# Patient Record
Sex: Male | Born: 1950 | Race: White | Hispanic: No | Marital: Married | Smoking: Never smoker
Health system: Southern US, Community
[De-identification: ages and names within clinical notes are randomized; demographics above are authoritative.]

## PROBLEM LIST (undated history)

## (undated) DIAGNOSIS — M549 Dorsalgia, unspecified: Secondary | ICD-10-CM

## (undated) HISTORY — PX: HERNIA REPAIR: SHX51

---

## 2003-01-28 ENCOUNTER — Ambulatory Visit (HOSPITAL_COMMUNITY): Admission: RE | Admit: 2003-01-28 | Discharge: 2003-01-28 | Payer: Self-pay | Admitting: Gastroenterology

## 2011-07-20 ENCOUNTER — Encounter (HOSPITAL_COMMUNITY): Payer: Self-pay

## 2011-07-20 ENCOUNTER — Emergency Department (HOSPITAL_COMMUNITY)
Admission: EM | Admit: 2011-07-20 | Discharge: 2011-07-21 | Disposition: A | Discharge status: Home Health | Payer: BC Managed Care – PPO | Attending: Emergency Medicine | Admitting: Emergency Medicine

## 2011-07-20 DIAGNOSIS — R112 Nausea with vomiting, unspecified: Secondary | ICD-10-CM | POA: Insufficient documentation

## 2011-07-20 DIAGNOSIS — R197 Diarrhea, unspecified: Secondary | ICD-10-CM | POA: Insufficient documentation

## 2011-07-20 DIAGNOSIS — R1110 Vomiting, unspecified: Secondary | ICD-10-CM

## 2011-07-20 HISTORY — DX: Dorsalgia, unspecified: M54.9

## 2011-07-20 NOTE — ED Notes (Signed)
Pt presents with no acute distress- N/V/d since this am

## 2011-07-21 LAB — DIFFERENTIAL
Basophils Relative: 0 % (ref 0–1)
Eosinophils Absolute: 0 10*3/uL (ref 0.0–0.7)
Monocytes Absolute: 0.6 10*3/uL (ref 0.1–1.0)
Monocytes Relative: 4 % (ref 3–12)

## 2011-07-21 LAB — URINALYSIS, ROUTINE W REFLEX MICROSCOPIC
Glucose, UA: 100 mg/dL — AB
Nitrite: NEGATIVE
Protein, ur: >300 mg/dL — AB

## 2011-07-21 LAB — COMPREHENSIVE METABOLIC PANEL
Albumin: 5 g/dL (ref 3.5–5.2)
BUN: 27 mg/dL — ABNORMAL HIGH (ref 6–23)
Chloride: 101 mEq/L (ref 96–112)
Creatinine, Ser: 1.66 mg/dL — ABNORMAL HIGH (ref 0.50–1.35)
GFR calc Af Amer: 51 mL/min — ABNORMAL LOW (ref >90)
Glucose, Bld: 130 mg/dL — ABNORMAL HIGH (ref 70–99)
Total Bilirubin: 0.8 mg/dL (ref 0.3–1.2)
Total Protein: 8.8 g/dL — ABNORMAL HIGH (ref 6.0–8.3)

## 2011-07-21 LAB — CBC
HCT: 51.5 % (ref 39.0–52.0)
Hemoglobin: 18.1 g/dL — ABNORMAL HIGH (ref 13.0–17.0)
MCH: 30.1 pg (ref 26.0–34.0)
MCHC: 35.1 g/dL (ref 30.0–36.0)

## 2011-07-21 LAB — URINE MICROSCOPIC-ADD ON

## 2011-07-21 MED ORDER — ONDANSETRON HCL 4 MG/2ML IJ SOLN
4.0000 mg | Freq: Once | INTRAMUSCULAR | Status: AC
  Administered 2011-07-21: 4 mg via INTRAVENOUS
  Filled 2011-07-21: qty 2

## 2011-07-21 MED ORDER — ONDANSETRON HCL 4 MG PO TABS: 4.0000 mg | ORAL_TABLET | Freq: Four times a day (QID) | ORAL | Status: AC

## 2011-07-21 MED ORDER — ACETAMINOPHEN 325 MG PO TABS
650.0000 mg | ORAL_TABLET | Freq: Once | ORAL | Status: AC
  Administered 2011-07-21: 650 mg via ORAL
  Filled 2011-07-21: qty 1

## 2011-07-21 MED ORDER — SODIUM CHLORIDE 0.9 % IV BOLUS (SEPSIS)
1000.0000 mL | Freq: Once | INTRAVENOUS | Status: AC
  Administered 2011-07-21: 1000 mL via INTRAVENOUS

## 2011-07-21 NOTE — ED Provider Notes (Signed)
History     CSN: [id number]  Arrival date & time 07/20/11  2103   First MD Initiated Contact with Patient 07/21/11 0009      Chief Complaint  Patient presents with  . Nausea  . Emesis  . Diarrhea    (Consider location/radiation/quality/duration/timing/severity/associated sxs/prior treatment) Patient is a 61 y.o. male presenting with vomiting. The history is provided by the patient.  Emesis  This is a new problem. The current episode started 6 to 12 hours ago. The problem occurs 5 to 10 times per day. The problem has not changed since onset.The emesis has an appearance of stomach contents. The maximum temperature recorded prior to his arrival was 101 to 101.9 F. The fever has been present for less than 1 day. Associated symptoms include abdominal pain, chills, diarrhea and a fever. Associated symptoms comments: Abdominal cramps. Risk factors include ill contacts.    Past Medical History  Diagnosis Date  . Back pain     Past Surgical History  Procedure Date  . Hernia repair     No family history on file.  History  Substance Use Topics  . Smoking status: Never Smoker   . Smokeless tobacco: Not on file  . Alcohol Use: No      Review of Systems  Constitutional: Positive for fever and chills.  Gastrointestinal: Positive for vomiting, abdominal pain and diarrhea.  All other systems reviewed and are negative.    Allergies  Review of patient's allergies indicates no known allergies.  Home Medications   Current Outpatient Rx  Name Route Sig Dispense Refill  . CALCIUM CARBONATE ANTACID 500 MG PO CHEW Oral Chew 1-2 tablets by mouth 3 (three) times daily as needed. For indigestion      BP 129/93  Pulse 109  Temp(Src) 99.4 F (37.4 C) (Oral)  Resp 18  Wt 230 lb (104.327 kg)  SpO2 100%  Physical Exam  Nursing note and vitals reviewed. Constitutional: He is oriented to person, place, and time. He appears well-developed and well-nourished. No distress.  HENT:    Head: Normocephalic and atraumatic.  Mouth/Throat: Oropharynx is clear and moist. Mucous membranes are dry.  Eyes: Conjunctivae and EOM are normal. Pupils are equal, round, and reactive to light.  Neck: Normal range of motion. Neck supple.  Cardiovascular: Regular rhythm and intact distal pulses.  Tachycardia present.   No murmur heard. Pulmonary/Chest: Effort normal and breath sounds normal. No respiratory distress. He has no wheezes. He has no rales.  Abdominal: Soft. He exhibits no distension. There is no tenderness. There is no rebound and no guarding.  Musculoskeletal: Normal range of motion. He exhibits no edema and no tenderness.  Neurological: He is alert and oriented to person, place, and time.  Skin: Skin is warm and dry. No rash noted. No erythema.  Psychiatric: He has a normal mood and affect. His behavior is normal.    ED Course  Procedures (including critical care time)  Labs Reviewed  CBC - Abnormal; Notable for the following:    WBC 13.7 (*)    RBC 6.01 (*)    Hemoglobin 18.1 (*)    All other components within normal limits  DIFFERENTIAL - Abnormal; Notable for the following:    Neutrophils Relative 93 (*)    Neutro Abs 12.7 (*)    Lymphocytes Relative 3 (*)    Lymphs Abs 0.4 (*)    All other components within normal limits  COMPREHENSIVE METABOLIC PANEL - Abnormal; Notable for the following:  Glucose, Bld 130 (*)    BUN 27 (*)    Creatinine, Ser 1.66 (*)    Total Protein 8.8 (*)    GFR calc non Af Amer 44 (*)    GFR calc Af Amer 51 (*)    All other components within normal limits  URINALYSIS, ROUTINE W REFLEX MICROSCOPIC - Abnormal; Notable for the following:    Color, Urine ORANGE (*) BIOCHEMICALS MAY BE AFFECTED BY COLOR   APPearance CLOUDY (*)    Specific Gravity, Urine 1.035 (*)    Glucose, UA 100 (*)    Bilirubin Urine MODERATE (*)    Ketones, ur 15 (*)    Protein, ur >300 (*)    Leukocytes, UA SMALL (*)    All other components within normal  limits  URINE MICROSCOPIC-ADD ON - Abnormal; Notable for the following:    Squamous Epithelial / LPF FEW (*)    Bacteria, UA MANY (*)    Casts HYALINE CASTS (*)    All other components within normal limits   No results found.   1. Vomiting and diarrhea       MDM   Pt with symptoms most consistent with a viral process with fever/vomitting/diarrhea.  Denies bad food exposure and recent travel out of the country.  No recent abx.  No hx concerning for GU pathology or kidney stones.  Pt is awake and alert on exam without peritoneal signs.  No exam findings suggestive of appendicitis, pancreatitis, diverticulitis.  The patient had lab tests done prior to me seeing him. It showed leukocytosis and a contaminated urine sample however patient has no complaints of urinary symptoms and feel that is unlikely. Due to the sudden nature of his above symptoms feel a viral process is most likely.  Pt after fluids is tolerating po's and no more abd cramping and abd exam is benign.  Pt dced home to f/u with PCP if not better by tues.        Gwyneth Sprout, MD 07/21/11 [phone]

## 2011-07-21 NOTE — Discharge Instructions (Signed)
Diet for Diarrhea, Adult Having frequent, runny stools (diarrhea) has many causes. Diarrhea may be caused or worsened by food or drink. Diarrhea may be relieved by changing your diet. IF YOU ARE NOT TOLERATING SOLID FOODS:  Drink enough water and fluids to keep your urine clear or pale yellow.   Avoid sugary drinks and sodas as well as milk-based beverages.   Avoid beverages containing caffeine and alcohol.   You may try rehydrating beverages. You can make your own by following this recipe:    tsp table salt.    tsp baking soda.   ? tsp salt substitute (potassium chloride).   1 tbs + 1 tsp sugar.   1 qt water.  As your stools become more solid, you can start eating solid foods. Add foods one at a time. If a certain food causes your diarrhea to get worse, avoid that food and try other foods. A low fiber, low-fat, and lactose-free diet is recommended. Small, frequent meals may be better tolerated.  Starches  Allowed:  White, French, and pita breads, plain rolls, buns, bagels. Plain muffins, matzo. Soda, saltine, or graham crackers. Pretzels, melba toast, zwieback. Cooked cereals made with water: cornmeal, farina, cream cereals. Dry cereals: refined corn, wheat, rice. Potatoes prepared any way without skins, refined macaroni, spaghetti, noodles, refined rice.   Avoid:  Bread, rolls, or crackers made with whole wheat, multi-grains, rye, bran seeds, nuts, or coconut. Corn tortillas or taco shells. Cereals containing whole grains, multi-grains, bran, coconut, nuts, or raisins. Cooked or dry oatmeal. Coarse wheat cereals, granola. Cereals advertised as "high-fiber." Potato skins. Whole grain pasta, wild or brown rice. Popcorn. Sweet potatoes/yams. Sweet rolls, doughnuts, waffles, pancakes, sweet breads.  Vegetables  Allowed: Strained tomato and vegetable juices. Most well-cooked and canned vegetables without seeds. Fresh: Tender lettuce, cucumber without the skin, cabbage, spinach, bean  sprouts.   Avoid: Fresh, cooked, or canned: Artichokes, baked beans, beet greens, broccoli, Brussels sprouts, corn, kale, legumes, peas, sweet potatoes. Cooked: Green or red cabbage, spinach. Avoid large servings of any vegetables, because vegetables shrink when cooked, and they contain more fiber per serving than fresh vegetables.  Fruit  Allowed: All fruit juices except prune juice. Cooked or canned: Apricots, applesauce, cantaloupe, cherries, fruit cocktail, grapefruit, grapes, kiwi, mandarin oranges, peaches, pears, plums, watermelon. Fresh: Apples without skin, ripe banana, grapes, cantaloupe, cherries, grapefruit, peaches, oranges, plums. Keep servings limited to  cup or 1 piece.   Avoid: Fresh: Apple with skin, apricots, mango, pears, raspberries, strawberries. Prune juice, stewed or dried prunes. Dried fruits, raisins, dates. Large servings of all fresh fruits.  Meat and Meat Substitutes  Allowed: Ground or well-cooked tender beef, ham, veal, lamb, pork, or poultry. Eggs, plain cheese. Fish, oysters, shrimp, lobster, other seafoods. Liver, organ meats.   Avoid: Tough, fibrous meats with gristle. Peanut butter, smooth or chunky. Cheese, nuts, seeds, legumes, dried peas, beans, lentils.  Milk  Allowed: Yogurt, lactose-free milk, kefir, drinkable yogurt, buttermilk, soy milk.   Avoid: Milk, chocolate milk, beverages made with milk, such as milk shakes.  Soups  Allowed: Bouillon, broth, or soups made from allowed foods. Any strained soup.   Avoid: Soups made from vegetables that are not allowed, cream or milk-based soups.  Desserts and Sweets  Allowed: Sugar-free gelatin, sugar-free frozen ice pops made without sugar alcohol.   Avoid: Plain cakes and cookies, pie made with allowed fruit, pudding, custard, cream pie. Gelatin, fruit, ice, sherbet, frozen ice pops. Ice cream, ice milk without nuts. Plain hard candy,   honey, jelly, molasses, syrup, sugar, chocolate syrup, gumdrops,  marshmallows.  Fats and Oils  Allowed: Avoid any fats and oils.   Avoid: Seeds, nuts, olives, avocados. Margarine, butter, cream, mayonnaise, salad oils, plain salad dressings made from allowed foods. Plain gravy, crisp bacon without rind.  Beverages  Allowed: Water, decaffeinated teas, oral rehydration solutions, sugar-free beverages.   Avoid: Fruit juices, caffeinated beverages (coffee, tea, soda or pop), alcohol, sports drinks, or lemon-lime soda or pop.  Condiments  Allowed: Ketchup, mustard, horseradish, vinegar, cream sauce, cheese sauce, cocoa powder. Spices in moderation: allspice, basil, bay leaves, celery powder or leaves, cinnamon, cumin powder, curry powder, ginger, mace, marjoram, onion or garlic powder, oregano, paprika, parsley flakes, ground pepper, rosemary, sage, savory, tarragon, thyme, turmeric.   Avoid: Coconut, honey.  Weight Monitoring: Weigh yourself every day. You should weigh yourself in the morning after you urinate and before you eat breakfast. Wear the same amount of clothing when you weigh yourself. Record your weight daily. Bring your recorded weights to your clinic visits. Tell your caregiver right away if you have gained 3 lb/1.4 kg or more in 1 day, 5 lb/2.3 kg in a week, or whatever amount you were told to report. SEEK IMMEDIATE MEDICAL CARE IF:   You are unable to keep fluids down.   You start to throw up (vomit) or diarrhea keeps coming back (persistent).   Abdominal pain develops, increases, or can be felt in one place (localizes).   You have an oral temperature above 102 F (38.9 C), not controlled by medicine.   Diarrhea contains blood or mucus.   You develop excessive weakness, dizziness, fainting, or extreme thirst.  MAKE SURE YOU:   Understand these instructions.   Will watch your condition.   Will get help right away if you are not doing well or get worse.  Document Released: 07/13/2003 Document Revised: 04/11/2011 Document Reviewed:  11/03/2008 [employer] Patient Information 2012 [employer]. 

## 2014-07-22 ENCOUNTER — Other Ambulatory Visit: Payer: Self-pay | Admitting: Family Medicine

## 2014-07-22 DIAGNOSIS — Z205 Contact with and (suspected) exposure to viral hepatitis: Secondary | ICD-10-CM

## 2014-07-29 ENCOUNTER — Ambulatory Visit
Admission: RE | Admit: 2014-07-29 | Discharge: 2014-07-29 | Disposition: A | Discharge status: Home / Self Care | Payer: BC Managed Care – PPO | Source: Ambulatory Visit | Attending: Family Medicine | Admitting: Family Medicine

## 2014-07-29 DIAGNOSIS — Z205 Contact with and (suspected) exposure to viral hepatitis: Secondary | ICD-10-CM

## 2016-05-01 IMAGING — US US ABDOMEN COMPLETE
1 series · 14 of 25 positions shown · non-contrast
Comparison: None.

CLINICAL DATA: Hepatitis-B exposure.

EXAM:
ULTRASOUND ABDOMEN COMPLETE

[Series 1: us abdomen complete · 0.37mm/px · 14 of 68 slices shown]
[im 1/68]
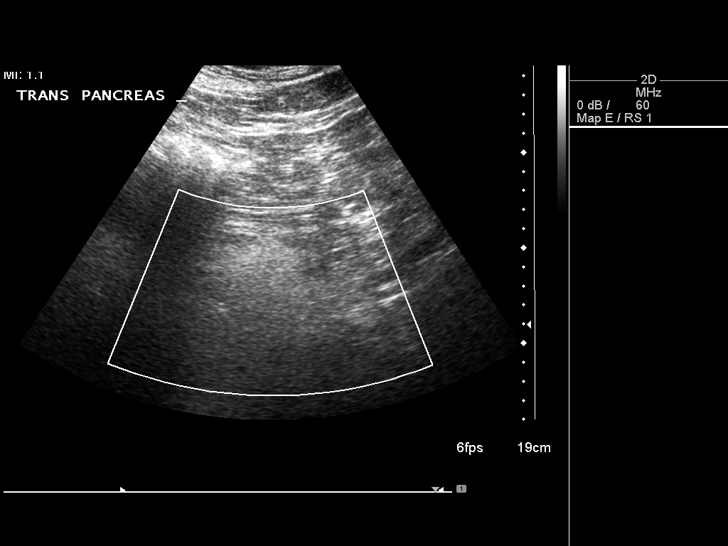
[im 6/68]
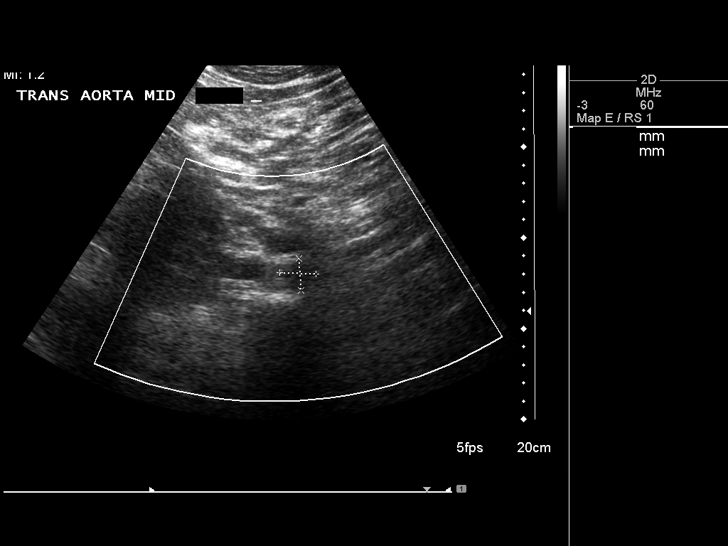
[im 12/68]
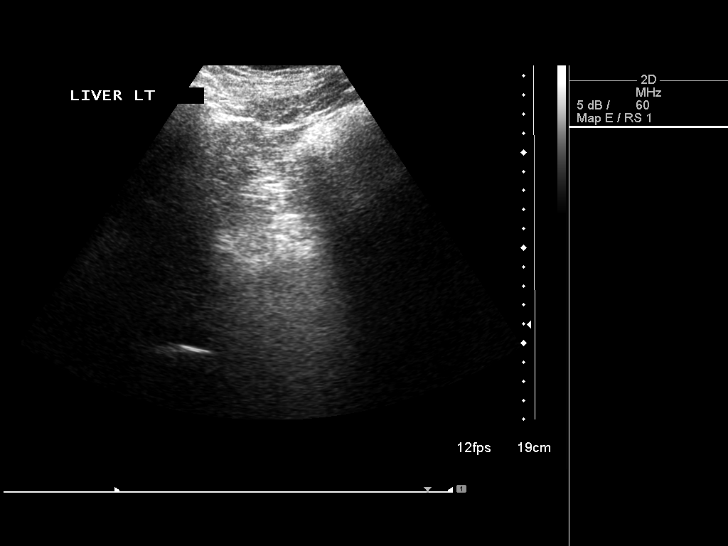
[im 17/68]
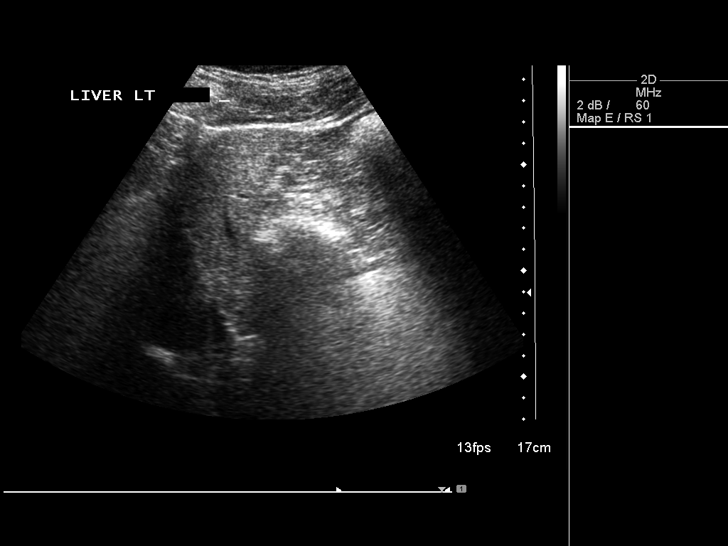
[im 23/68]
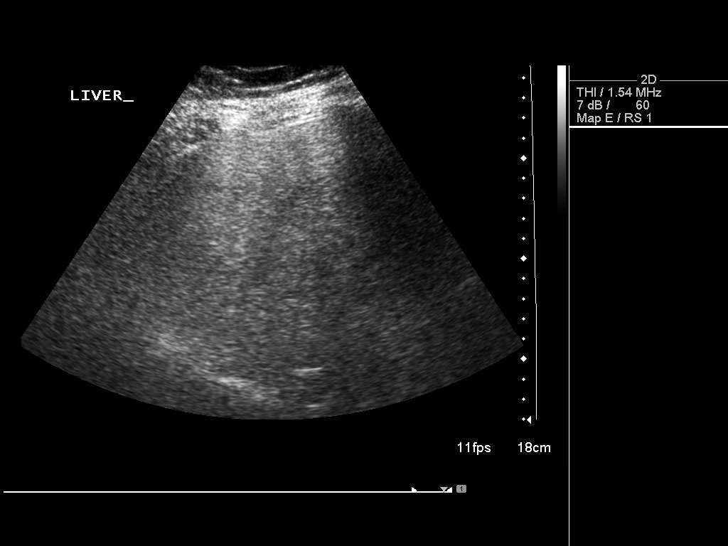
[im 26/68]
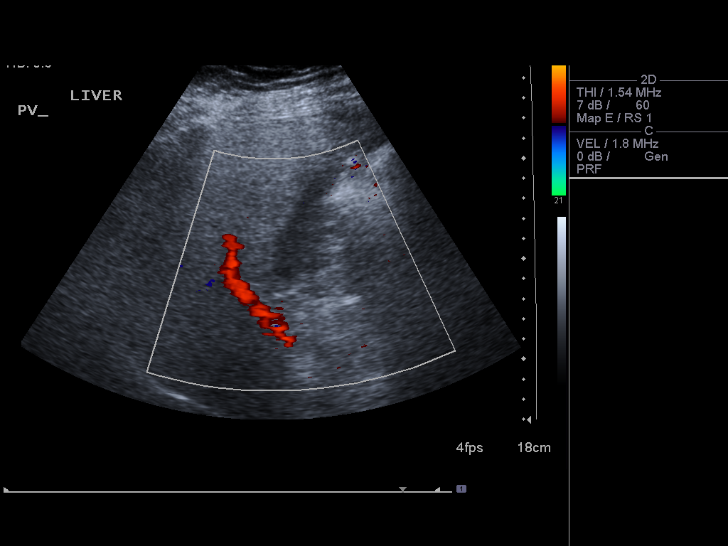
[im 31/68]
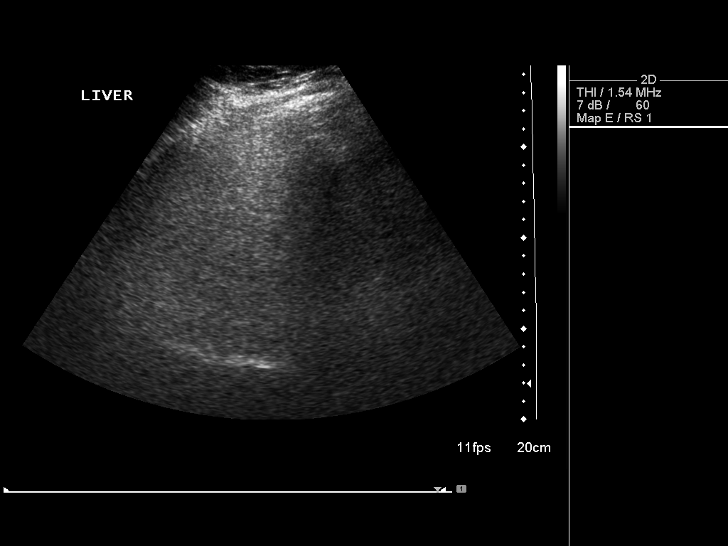
[im 37/68]
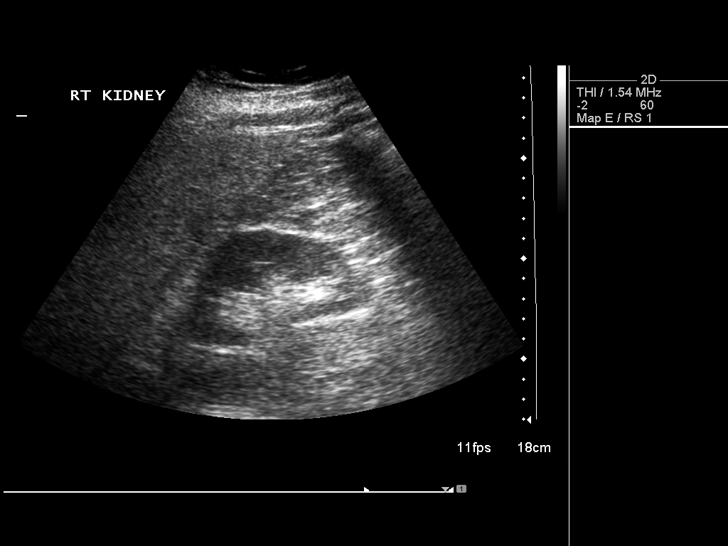
[im 42/68]
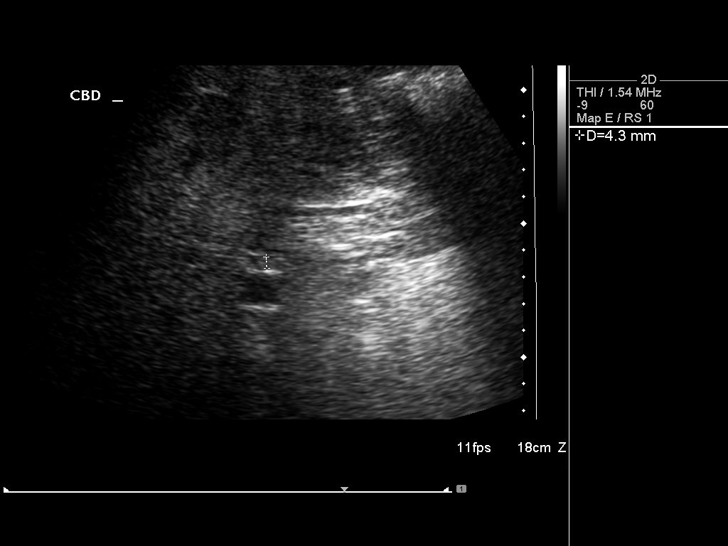
[im 45/68]
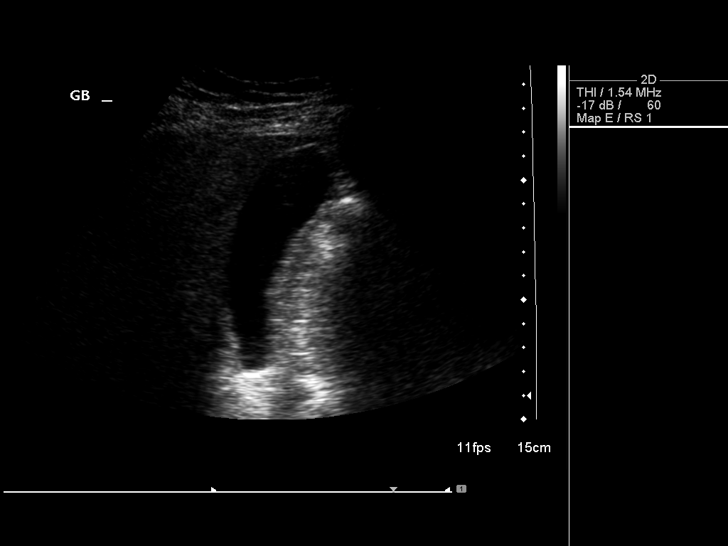
[im 51/68]
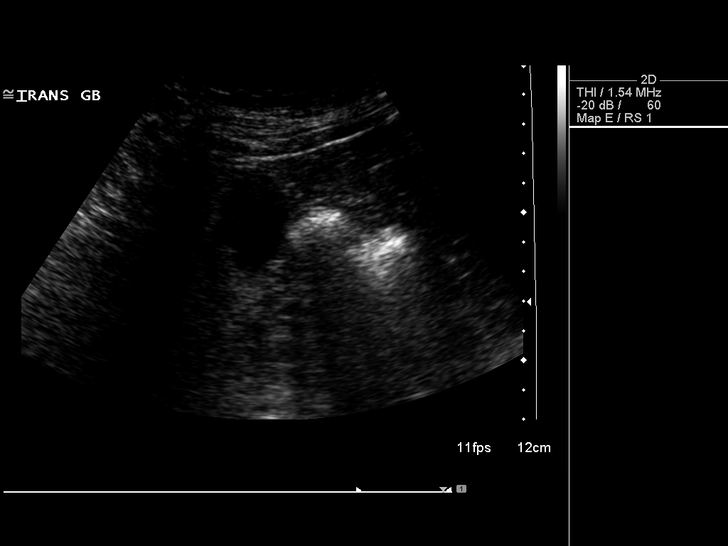
[im 56/68]
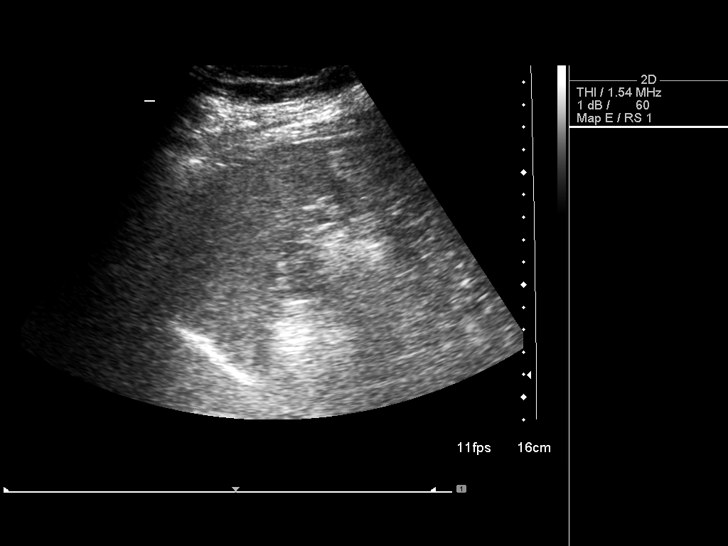
[im 62/68]
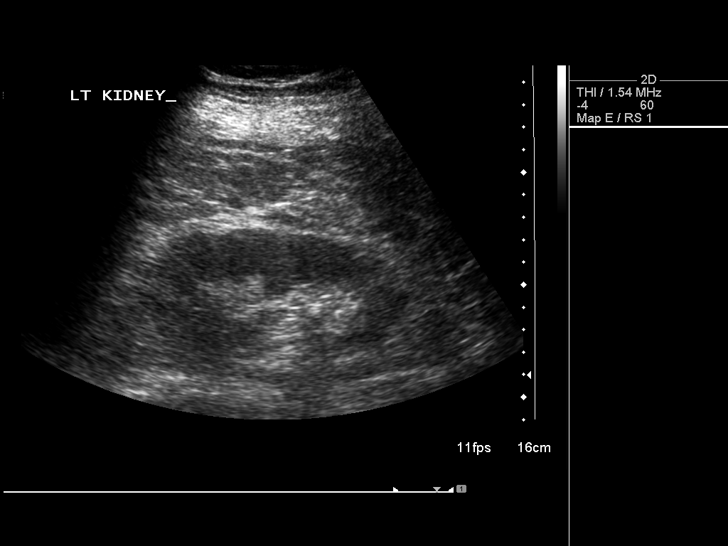
[im 68/68]
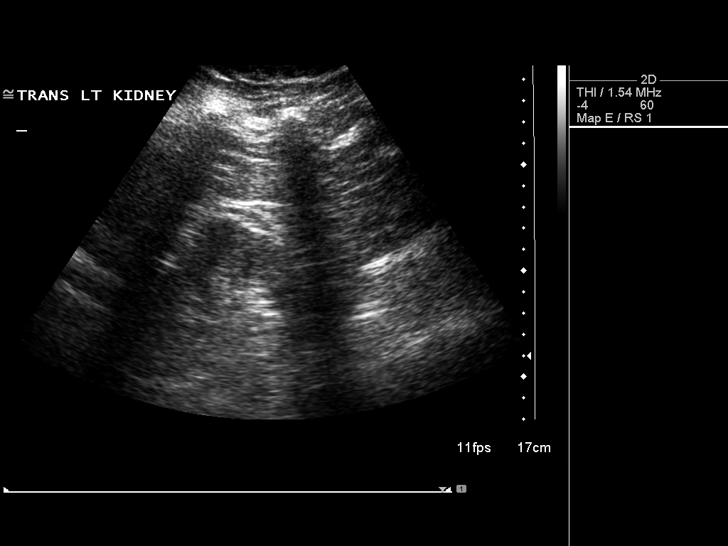

[14 of 25 positions shown; findings below may reference images not displayed]

FINDINGS: Gallbladder: No gallstones or wall thickening visualized. No
sonographic Murphy sign noted.

Common bile duct: Diameter: 4.3 mm

Liver: Liver is echogenic suggesting fatty infiltration and/or
hepatocellular disease.

IVC: No abnormality visualized.

Pancreas: Visualized portion unremarkable.

Spleen: Size and appearance within normal limits.

Right Kidney: Length: 10.5 cm. Echogenicity within normal limits. No
mass or hydronephrosis visualized.

Left Kidney: Length: 11.4 cm. Echogenicity within normal limits. No
mass or hydronephrosis visualized.

Abdominal aorta: No aneurysm visualized.

Other findings: None.
IMPRESSION: Echogenic liver suggesting fatty infiltration and/or hepatocellular
disease. Exam is otherwise negative.

## 2019-06-04 ENCOUNTER — Ambulatory Visit: Payer: Self-pay

## 2019-06-10 ENCOUNTER — Ambulatory Visit: Payer: Medicare PPO | Attending: Internal Medicine

## 2019-06-10 DIAGNOSIS — Z23 Encounter for immunization: Secondary | ICD-10-CM | POA: Insufficient documentation

## 2019-06-10 NOTE — Progress Notes (Signed)
   Covid-19 Vaccination Clinic  Name:  Jason Herrera    MRN: [id number] DOB: [birth year]  06/10/2019  Jason Herrera was observed post Covid-19 immunization for 15 minutes without incidence. He was provided with Vaccine Information Sheet and instruction to access the V-Safe system.   Jason Herrera was instructed to call 911 with any severe reactions post vaccine: Marland Kitchen Difficulty breathing  . Swelling of your face and throat  . A fast heartbeat  . A bad rash all over your body  . Dizziness and weakness    Immunizations Administered    Name Date Dose VIS Date Route   Pfizer COVID-19 Vaccine 06/10/2019  1:27 PM 0.3 mL 04/16/2019 Intramuscular   Manufacturer: [employer], [employer]   Lot: [id number]   NDC: [address]-4

## 2019-06-25 ENCOUNTER — Ambulatory Visit: Payer: Self-pay

## 2019-07-05 ENCOUNTER — Ambulatory Visit: Payer: Medicare PPO | Attending: Internal Medicine

## 2019-07-05 DIAGNOSIS — Z23 Encounter for immunization: Secondary | ICD-10-CM | POA: Insufficient documentation

## 2019-07-05 NOTE — Progress Notes (Signed)
   Covid-19 Vaccination Clinic  Name:  Jason Herrera    MRN: [id number] DOB: 1950/12/04  07/05/2019  Jason Herrera was observed post Covid-19 immunization for 15 minutes without incidence. He was provided with Vaccine Information Sheet and instruction to access the V-Safe system.   Jason Herrera was instructed to call 911 with any severe reactions post vaccine: Marland Kitchen Difficulty breathing  . Swelling of your face and throat  . A fast heartbeat  . A bad rash all over your body  . Dizziness and weakness    Immunizations Administered    Name Date Dose VIS Date Route   Pfizer COVID-19 Vaccine 07/05/2019  4:17 PM 0.3 mL 04/16/2019 Intramuscular   Manufacturer: [employer], [employer]   Lot: [id number]   NDC: [address]-6

## 2019-09-01 DIAGNOSIS — D485 Neoplasm of uncertain behavior of skin: Secondary | ICD-10-CM | POA: Diagnosis not present

## 2019-09-01 DIAGNOSIS — C44311 Basal cell carcinoma of skin of nose: Secondary | ICD-10-CM | POA: Diagnosis not present

## 2019-09-01 DIAGNOSIS — Z23 Encounter for immunization: Secondary | ICD-10-CM | POA: Diagnosis not present

## 2019-09-01 DIAGNOSIS — B078 Other viral warts: Secondary | ICD-10-CM | POA: Diagnosis not present

## 2019-09-01 DIAGNOSIS — L821 Other seborrheic keratosis: Secondary | ICD-10-CM | POA: Diagnosis not present

## 2019-09-01 DIAGNOSIS — L820 Inflamed seborrheic keratosis: Secondary | ICD-10-CM | POA: Diagnosis not present

## 2019-11-11 DIAGNOSIS — C44311 Basal cell carcinoma of skin of nose: Secondary | ICD-10-CM | POA: Diagnosis not present

## 2019-11-16 DIAGNOSIS — Z800 Family history of malignant neoplasm of digestive organs: Secondary | ICD-10-CM | POA: Diagnosis not present

## 2019-11-16 DIAGNOSIS — K5730 Diverticulosis of large intestine without perforation or abscess without bleeding: Secondary | ICD-10-CM | POA: Diagnosis not present

## 2020-05-12 DIAGNOSIS — H524 Presbyopia: Secondary | ICD-10-CM | POA: Diagnosis not present

## 2020-05-12 DIAGNOSIS — H5203 Hypermetropia, bilateral: Secondary | ICD-10-CM | POA: Diagnosis not present

## 2020-05-12 DIAGNOSIS — H2513 Age-related nuclear cataract, bilateral: Secondary | ICD-10-CM | POA: Diagnosis not present

## 2020-07-04 DIAGNOSIS — L821 Other seborrheic keratosis: Secondary | ICD-10-CM | POA: Diagnosis not present

## 2020-07-04 DIAGNOSIS — Z0000 Encounter for general adult medical examination without abnormal findings: Secondary | ICD-10-CM | POA: Diagnosis not present

## 2020-07-04 DIAGNOSIS — Z85828 Personal history of other malignant neoplasm of skin: Secondary | ICD-10-CM | POA: Diagnosis not present

## 2020-07-04 DIAGNOSIS — Z125 Encounter for screening for malignant neoplasm of prostate: Secondary | ICD-10-CM | POA: Diagnosis not present

## 2020-07-04 DIAGNOSIS — E669 Obesity, unspecified: Secondary | ICD-10-CM | POA: Diagnosis not present

## 2020-07-04 DIAGNOSIS — E7800 Pure hypercholesterolemia, unspecified: Secondary | ICD-10-CM | POA: Diagnosis not present

## 2020-07-04 DIAGNOSIS — Z23 Encounter for immunization: Secondary | ICD-10-CM | POA: Diagnosis not present

## 2021-02-26 ENCOUNTER — Ambulatory Visit
Admission: RE | Admit: 2021-02-26 | Discharge: 2021-02-26 | Disposition: A | Discharge status: Home / Self Care | Payer: Medicare PPO | Source: Ambulatory Visit | Attending: Family Medicine | Admitting: Family Medicine

## 2021-02-26 ENCOUNTER — Other Ambulatory Visit: Payer: Self-pay | Admitting: Family Medicine

## 2021-02-26 DIAGNOSIS — R0781 Pleurodynia: Secondary | ICD-10-CM

## 2021-02-26 DIAGNOSIS — R0602 Shortness of breath: Secondary | ICD-10-CM | POA: Diagnosis not present

## 2021-02-26 DIAGNOSIS — S299XXA Unspecified injury of thorax, initial encounter: Secondary | ICD-10-CM | POA: Diagnosis not present

## 2021-02-26 DIAGNOSIS — J90 Pleural effusion, not elsewhere classified: Secondary | ICD-10-CM | POA: Diagnosis not present

## 2021-02-26 DIAGNOSIS — R251 Tremor, unspecified: Secondary | ICD-10-CM | POA: Diagnosis not present

## 2021-03-13 ENCOUNTER — Encounter: Payer: Self-pay | Admitting: Neurology

## 2021-03-21 NOTE — Progress Notes (Signed)
Assessment/Plan:   1.  Tremor predominant idiopathic Parkinson's disease.    -We discussed the diagnosis as well as pathophysiology of the disease.  We discussed treatment options as well as prognostic indicators.  Patient education was provided.  -We discussed that it used to be thought that levodopa would increase risk of melanoma but now it is believed that Parkinsons itself likely increases risk of melanoma. he is to get regular skin checks.  -Greater than 50% of the 60 minute visit was spent in counseling answering questions and talking about what to expect now as well as in the future.  We talked about medication options as well as potential future surgical options.  We talked about safety in the home.  -We decided to add carbidopa/levodopa 25/100.  1/2 tab tid x 1 wk, then 1/2 in am & noon & 1 at night for a week, then 1/2 in am &1 at noon &night for a week, then 1 po tid.  Risks, benefits, side effects and alternative therapies were discussed.  The opportunity to ask questions was given and they were answered to the best of my ability.  The patient expressed understanding and willingness to follow the outlined treatment protocols.  -I will refer the patient [facility] for [facility]  -We discussed community resources in the area including patient support groups and community exercise programs for PD and pt education was provided to the patient.  -He met with my social worker today.    Subjective:   Jason Herrera was seen today in the movement disorders clinic for neurologic consultation at the request of Carol Ada, MD.  The consultation is for the evaluation of L>RUE rest tremor.  Patient's wife accompanies the patient today.  Outside records that were made available to me were reviewed.  He was recently at primary care physician for rib pain.  He had been moving a mattress and hurt his ribs.  Had chest x-ray that did not demonstrate rib  fracture.  He did have a small left pleural effusion.  Tremor: Yes.     How long has it been going on? 1 year  At rest or with activation?  rest  Fam hx of tremor?  paternal GM had tremor (states that they called it bells palsy but she had tremor)  Located where?  Started L arm and wife notes it in the L leg now as well   Other Specific Symptoms:  Voice: no change Sleep: awakens to use RR but otherwise sleeps well  Vivid Dreams:  No.  Acting out dreams:  some sleep talking per wife (more moaning) Postural symptoms:  No.  Falls?  No. Bradykinesia symptoms: shuffling gait and difficulty getting out of a chair Loss of smell:  No. Loss of taste:  No. Urinary Incontinence:  No. Difficulty Swallowing:  No. Handwriting, micrographia: No. (He is R handed) Trouble with ADL's:  No.  Trouble buttoning clothing: No. Depression:  No. Memory changes:  No. N/V:  No. Lightheaded:  No.  Syncope: No. Diplopia:  No. Dyskinesia:  No.  Neuroimaging of the brain has not previously been performed.    ALLERGIES:  No Known Allergies  CURRENT MEDICATIONS:  Current Outpatient Medications  Medication Instructions   Calcium Carb-Cholecalciferol 500-2.5 MG-MCG CHEW Oral   calcium carbonate (TUMS - DOSED IN MG ELEMENTAL CALCIUM) 500 MG chewable tablet 1-2 tablets, 3 times daily PRN    Objective:   PHYSICAL EXAMINATION:    VITALS:  Vitals:   03/22/21 1336  BP: 127/82  Pulse: 65  SpO2: 96%  Weight: 261 lb 3.2 oz (118.5 kg)  Height: 5' 10"  (1.778 m)    GEN:  The patient appears stated age and is in NAD. HEENT:  Normocephalic, atraumatic.  The mucous membranes are moist. The superficial temporal arteries are without ropiness or tenderness. CV:  RRR Lungs:  CTAB Neck/HEME:  There are no carotid bruits bilaterally.  Neurological examination:  Orientation: The patient is alert and oriented x3.  Cranial nerves: There is good facial symmetry.  There is facial hypomimia.  Extraocular  muscles are intact. The visual fields are full to confrontational testing. The speech is fluent and clear. Soft palate rises symmetrically and there is no tongue deviation. Hearing is intact to conversational tone. Sensation: Sensation is intact to light touch throughout (facial, trunk, extremities). Vibration is intact at the bilateral big toe. There is no extinction with double simultaneous stimulation.  Motor: Strength is 5/5 in the bilateral upper and lower extremities.   Shoulder shrug is equal and symmetric.  There is no pronator drift. Deep tendon reflexes: Deep tendon reflexes are 2/4 at the bilateral biceps, triceps, brachioradialis, patella and achilles. Plantar responses are downgoing bilaterally.  Movement examination: Tone: There is nl tone in the bilateral upper extremities.  The tone in the lower extremities is nl.  Abnormal movements: there is LUE>LLE rest tremor that increases with distraction Coordination:  There is no decremation with RAM's, only with action of "turning in a lightbulb" and toe taps on the L.   All other RAM's are nl Gait and Station: The patient has no difficulty arising out of a deep-seated chair without the use of the hands. The patient's stride length is good with decreased arm swing bilaterally.   I have reviewed and interpreted the following labs independently   Chemistry      Component Value Date/Time   NA 137 07/20/2011 2344   K 4.4 07/20/2011 2344   CL 101 07/20/2011 2344   CO2 20 07/20/2011 2344   BUN 27 (H) 07/20/2011 2344   CREATININE 1.66 (H) 07/20/2011 2344      Component Value Date/Time   CALCIUM 10.1 07/20/2011 2344   ALKPHOS 87 07/20/2011 2344   AST 34 07/20/2011 2344   ALT 40 07/20/2011 2344   BILITOT 0.8 07/20/2011 2344      No results found for: TSH Lab Results  Component Value Date   WBC 13.7 (H) 07/20/2011   HGB 18.1 (H) 07/20/2011   HCT 51.5 07/20/2011   MCV 85.7 07/20/2011   PLT 272 07/20/2011   Patient had lab work  with primary care physician on July 04, 2020.  White blood cell 6.5, hemoglobin 16.4, hematocrit 49.4 and platelets 298.  Sodium was 138, potassium 4.6, chloride 103, CO2 29, BUN 16, creatinine 1.19, glucose 95, AST 27, ALT 29   Total time spent on today's visit was 45 minutes, including both face-to-face time and nonface-to-face time.  Time included that spent on review of records (prior notes available to me/labs/imaging if pertinent), discussing treatment and goals, answering patient's questions and coordinating care.  Cc:  Patient, No Pcp Per (Inactive)

## 2021-03-22 ENCOUNTER — Encounter: Payer: Self-pay | Admitting: Neurology

## 2021-03-22 ENCOUNTER — Ambulatory Visit: Payer: Medicare PPO | Admitting: Neurology

## 2021-03-22 ENCOUNTER — Other Ambulatory Visit: Payer: Self-pay

## 2021-03-22 VITALS — BP 127/82 | HR 65 | Ht 70.0 in | Wt 261.2 lb

## 2021-03-22 DIAGNOSIS — G20 Parkinson's disease: Secondary | ICD-10-CM | POA: Diagnosis not present

## 2021-03-22 MED ORDER — CARBIDOPA-LEVODOPA 25-100 MG PO TABS: 1.0000 | ORAL_TABLET | Freq: Three times a day (TID) | ORAL | 1 refills | Status: DC

## 2021-03-22 NOTE — Patient Instructions (Signed)
Start Carbidopa Levodopa as follows: Take 1/2 tablet three times daily, at least 30 minutes before meals (approximately 8am/noon/4pm), for one week Then take 1/2 tablet in the morning, 1/2 tablet in the afternoon, 1 tablet in the evening, at least 30 minutes before meals, for one week Then take 1/2 tablet in the morning, 1 tablet in the afternoon, 1 tablet in the evening, at least 30 minutes before meals, for one week Then take 1 tablet three times daily at 8am/noon/4pm, at least 30 minutes before meals   As a reminder, carbidopa/levodopa can be taken at the same time as a carbohydrate, but we like to have you take your pill either 30 minutes before a protein source or 1 hour after as protein can interfere with carbidopa/levodopa absorption.  You have been referred to [facility] for therapy. They will call you directly to schedule an appointment.

## 2021-03-28 ENCOUNTER — Other Ambulatory Visit: Payer: Self-pay

## 2021-03-28 MED ORDER — CARBIDOPA-LEVODOPA 25-100 MG PO TABS: 1.0000 | ORAL_TABLET | Freq: Three times a day (TID) | ORAL | 1 refills | Status: DC

## 2021-04-02 ENCOUNTER — Other Ambulatory Visit: Payer: Self-pay

## 2021-04-02 ENCOUNTER — Encounter: Payer: Self-pay | Admitting: Physical Therapy

## 2021-04-02 ENCOUNTER — Ambulatory Visit: Payer: Medicare PPO | Attending: Neurology | Admitting: Physical Therapy

## 2021-04-02 DIAGNOSIS — R2689 Other abnormalities of gait and mobility: Secondary | ICD-10-CM | POA: Diagnosis not present

## 2021-04-02 DIAGNOSIS — R2681 Unsteadiness on feet: Secondary | ICD-10-CM | POA: Diagnosis not present

## 2021-04-02 DIAGNOSIS — G20 Parkinson's disease: Secondary | ICD-10-CM | POA: Insufficient documentation

## 2021-04-02 DIAGNOSIS — M6281 Muscle weakness (generalized): Secondary | ICD-10-CM | POA: Insufficient documentation

## 2021-04-02 DIAGNOSIS — R29818 Other symptoms and signs involving the nervous system: Secondary | ICD-10-CM | POA: Insufficient documentation

## 2021-04-02 NOTE — Therapy (Signed)
[facility] [address], [address] [place], [place], [address] Phone: [phone]   Fax:  [phone]  Physical Therapy Evaluation  Patient Details  Name: Jason Herrera MRN: [id number] Date of Birth: Dec 31, 1950 Referring Provider (PT): Kerin Salen, DO   Encounter Date: 04/02/2021   PT End of Session - 04/02/21 1711     Visit Number 1    Number of Visits 10    Date for PT Re-Evaluation 05/04/21    Authorization Type [employer]    PT Start Time 1615    PT Stop Time 1705    PT Time Calculation (min) 50 min    Activity Tolerance Patient tolerated treatment well    Behavior During Therapy [facility] for tasks assessed/performed             Past Medical History:  Diagnosis Date   Back pain     Past Surgical History:  Procedure Laterality Date   HERNIA REPAIR      There were no vitals filed for this visit.    Subjective Assessment - 04/02/21 1618     Subjective Started with tremors a little over a year ago.  Saw Dr. Arbutus Leas and she thinks that therapy may be helpful.  Wife notices shuffling gait while shopping.  No falls.    Patient is accompained by: Family member   wife   Patient Stated Goals Pt's goals for therapy are to minimizing impact of tremors.    Currently in Pain? No/denies                [facility] PT Assessment - 04/02/21 0001       Assessment   Medical Diagnosis Parkinson's disease    Referring Provider (PT) Lurena Joiner Tat, DO    Onset Date/Surgical Date 03/22/21   MD visit   Hand Dominance Right      Precautions   Precautions Fall      Balance Screen   Has the patient fallen in the past 6 months No    Has the patient had a decrease in activity level because of a fear of falling?  No    Is the patient reluctant to leave their home because of a fear of falling?  No      Home Environment   Living Environment Private residence    Living Arrangements Spouse/significant other    Available Help at Discharge  Family    Type of Home House    Home Access Stairs to enter   threshold into back door; 2 steps up onto [employer] of Steps 2-3   side   Entrance Stairs-Rails Left;Right    Home Layout Two level;Able to live on main level with bedroom/bathroom    Home Equipment None      Prior Function   Level of Independence Independent    Vocation Retired    Leisure Helping son renovating house, Presenter, broadcasting; has part-time pool business in summers      Observation/Other Assessments   Focus on Therapeutic Outcomes (FOTO)  NA      Tone   Assessment Location Right Lower Extremity;Left Lower Extremity      ROM / Strength   AROM / PROM / Strength AROM;Strength      AROM   Overall AROM  Deficits      Strength   Overall Strength Within functional limits for tasks performed    Overall Strength Comments grossly tested at least 4+/5 BLEs  Transfers   Transfers Sit to Stand;Stand to Sit    Sit to Stand 6: Modified independent (Device/Increase time);Without upper extremity assist;From chair/3-in-1    Five time sit to stand comments  14.87    Stand to Sit 6: Modified independent (Device/Increase time);Without upper extremity assist;To chair/3-in-1      Ambulation/Gait   Ambulation/Gait Yes    Ambulation/Gait Assistance 5: Supervision    Ambulation Distance (Feet) 60 Feet   x 2   Assistive device None    Gait Pattern Step-through pattern;Decreased arm swing - left;Decreased trunk rotation    Ambulation Surface Level;Indoor    Gait velocity 10.87 sec  3.02 ft/sec      Standardized Balance Assessment   Standardized Balance Assessment Timed Up and Go Test;Mini-BESTest      Mini-BESTest   Sit To Stand Normal: Comes to stand without use of hands and stabilizes independently.    Rise to Toes Normal: Stable for 3 s with maximum height.    Stand on one leg (left) Normal: 20 s.    Stand on one leg (right) Normal: 20 s.    Stand on one leg - lowest score 2    Compensatory Stepping  Correction - Forward Normal: Recovers independently with a single, large step (second realignement is allowed).    Compensatory Stepping Correction - Backward Normal: Recovers independently with a single, large step    Compensatory Stepping Correction - Left Lateral Normal: Recovers independently with 1 step (crossover or lateral OK)    Compensatory Stepping Correction - Right Lateral Normal: Recovers independently with 1 step (crossover or lateral OK)    Stepping Corredtion Lateral - lowest score 2    Stance - Feet together, eyes open, firm surface  Normal: 30s    Stance - Feet together, eyes closed, foam surface  Moderate: < 30s    Incline - Eyes Closed Normal: Stands independently 30s and aligns with gravity    Change in Gait Speed Normal: Significantly changes walkling speed without imbalance    Walk with head turns - Horizontal Moderate: performs head turns with reduction in gait speed.    Walk with pivot turns Normal: Turns with feet close FAST (< 3 steps) with good balance.    Step over obstacles Normal: Able to step over box with minimal change of gait speed and with good balance.    Timed UP & GO with Dual Task Normal: No noticeable change in sitting, standing or walking while backward counting when compared to TUG without    Mini-BEST total score 26      Timed Up and Go Test   TUG Normal TUG;Manual TUG;Cognitive TUG    Normal TUG (seconds) 11.69    Manual TUG (seconds) 11.94    Cognitive TUG (seconds) 12.07    TUG Comments Scores >13.5-15 sec indicate increased fall risk.      RLE Tone   RLE Tone Within Functional Limits      LLE Tone   LLE Tone Mild                        Objective measurements completed on examination: See above findings.                PT Education - 04/02/21 1711     Education Details PT eval results, POC    Person(s) Educated Patient;Spouse    Methods Explanation    Comprehension Verbalized understanding  PT Short Term Goals - 04/02/21 1722       PT SHORT TERM GOAL #1   Title Pt will be independent with HEP for improved strength, balance, transfers, and gait.  TARGET 04/27/2021    Time 3    Period Weeks    Status New               PT Long Term Goals - 04/02/21 1718       PT LONG TERM GOAL #1   Title Pt will verbalize plans for ongoing fitness routine post therapy discharge to maximize gains made in PT.  TARGET 05/04/2021    Time 5    Period Weeks    Status New      PT LONG TERM GOAL #2   Title Pt will improve 5x sit<>stand to less than or equal to 11.5 sec to demonstrate improved functional strength and transfer efficiency.    Baseline 14.87    Time 5    Period Weeks    Status New      PT LONG TERM GOAL #3   Title Pt will stand at least 20 seconds on foam surface, eyes closed, romberg stance, for improved vestibular system use for balance    Baseline < 10 sec    Time 5    Period Weeks    Status New      PT LONG TERM GOAL #4   Title Pt will verbalize understanding of Parkinson's disease related resources in local community.    Time 5    Period Weeks    Status New                    Plan - 04/02/21 1712     Clinical Impression Statement Pt is a 70 year old male who presents to OPPT with recent diagnosis of Parkinson's disease.  He started Sinemet and is ramping up dosage (not yet on full dose yet).  He has hx of tremor x 1 year in LUE; he presents with bradykinesia, decreased functional strength, decreased high level balance (possible decreased vestibular system use for gait), decreased timing and coordination of gait.  He does not currently participate in exercise program.  He is active with yardwork and home renovation work with his son.  He would benefit from skilled PT to address the above stated deficits to improve overall functional mobility and decrease fall risk.    Personal Factors and Comorbidities Comorbidity 1;Time since onset of  injury/illness/exacerbation   newly diagnosed 03/2021 and started on Sinemet   Comorbidities PMH:  hx of back pain    Examination-Activity Limitations Locomotion Level;Transfers    Examination-Participation Restrictions Community Activity;Yard Work    Conservation officer, historic buildings Stable/Uncomplicated    Optometrist Low    Rehab Potential Good    PT Frequency 2x / week   1x/wk for 1 week, then 2x/wk for 4 weeks   PT Duration Other (comment)   5 weeks   PT Treatment/Interventions ADLs/Self Care Home Management;Neuromuscular re-education;Balance training;Therapeutic exercise;Therapeutic activities;Functional mobility training;Gait training;Patient/family education    PT Next Visit Plan Initiate PWR! Moves:  standing, sitting; vestibular balance/corner exercises for compliant surfaces.  Will need to discuss walking program, optimal fitness program for PD (at some point).  Need to ask about OT (we didn't discuss at eval, but I think given tremor LUE, he would benefit)    Recommended Other Services Discuss about OT eval/treatment due to LUE tremors and positioning of LUE with gait (decr.  arm swing)    Consulted and Agree with Plan of Care Patient;Family member/caregiver             Patient will benefit from skilled therapeutic intervention in order to improve the following deficits and impairments:  Abnormal gait, Difficulty walking, Decreased balance, Decreased strength, Postural dysfunction  Visit Diagnosis: Other abnormalities of gait and mobility  Unsteadiness on feet  Muscle weakness (generalized)  Other symptoms and signs involving the nervous system     Problem List Patient Active Problem List   Diagnosis Date Noted   Parkinson's disease (HCC) 03/22/2021    Doyl Bitting W., PT 04/02/2021, 5:24 PM  [facility] [address], [address] [place], [place], [address] Phone: [phone]   Fax:  [phone]  Name: CAMARION WEIER MRN: [id number] Date of Birth: [birth year]

## 2021-04-04 ENCOUNTER — Encounter: Payer: Self-pay | Admitting: Physical Therapy

## 2021-04-04 ENCOUNTER — Other Ambulatory Visit: Payer: Self-pay

## 2021-04-04 ENCOUNTER — Ambulatory Visit: Payer: Medicare PPO | Admitting: Physical Therapy

## 2021-04-04 DIAGNOSIS — M6281 Muscle weakness (generalized): Secondary | ICD-10-CM | POA: Diagnosis not present

## 2021-04-04 DIAGNOSIS — R2689 Other abnormalities of gait and mobility: Secondary | ICD-10-CM | POA: Diagnosis not present

## 2021-04-04 DIAGNOSIS — R2681 Unsteadiness on feet: Secondary | ICD-10-CM

## 2021-04-04 DIAGNOSIS — R29818 Other symptoms and signs involving the nervous system: Secondary | ICD-10-CM

## 2021-04-04 DIAGNOSIS — G20 Parkinson's disease: Secondary | ICD-10-CM | POA: Diagnosis not present

## 2021-04-04 NOTE — Therapy (Signed)
[facility] [address], [address] [place], [place], [address] Phone: [phone]   Fax:  [phone]  Physical Therapy Treatment  Patient Details  Name: Jason Herrera MRN: [id number] Date of Birth: Jan 31, 1951 Referring Provider (PT): Kerin Salen, DO   Encounter Date: 04/04/2021   PT End of Session - 04/04/21 1240     Visit Number 2    Number of Visits 10    Date for PT Re-Evaluation 05/04/21    Authorization Type [employer]    PT Start Time 1145    PT Stop Time 1230    PT Time Calculation (min) 45 min    Activity Tolerance Patient tolerated treatment well    Behavior During Therapy [facility] for tasks assessed/performed             Past Medical History:  Diagnosis Date   Back pain     Past Surgical History:  Procedure Laterality Date   HERNIA REPAIR      There were no vitals filed for this visit.   Subjective Assessment - 04/04/21 1151     Subjective Denies any falls or changes since last visit.    Patient is accompained by: Family member   wife   Patient Stated Goals Pt's goals for therapy are to minimizing impact of tremors.    Currently in Pain? No/denies                 [facility] Adult PT Treatment/Exercise - 04/04/21 0001       Exercises   Exercises Knee/Hip      Knee/Hip Exercises: Aerobic   Nustep Level 5 all 4 extremities x 8 minutes with SPM>65 for first 3 minutes then increased to >70 with cues.             PWR! Moves in sitting all 4 moves x 20 reps each.  Pt needed cues to slow down and for technique/positioning.  Pt needs rest breaks in between exercises.      PT Education - 04/04/21 1239     Education Details PWR! Moves in sitting, introduction to importance of exercise and PD    Person(s) Educated Patient;Spouse    Methods Explanation;Demonstration;Verbal cues;Handout    Comprehension Verbalized understanding;Need further instruction              PT Short Term Goals -  04/02/21 1722       PT SHORT TERM GOAL #1   Title Pt will be independent with HEP for improved strength, balance, transfers, and gait.  TARGET 04/27/2021    Time 3    Period Weeks    Status New               PT Long Term Goals - 04/02/21 1718       PT LONG TERM GOAL #1   Title Pt will verbalize plans for ongoing fitness routine post therapy discharge to maximize gains made in PT.  TARGET 05/04/2021    Time 5    Period Weeks    Status New      PT LONG TERM GOAL #2   Title Pt will improve 5x sit<>stand to less than or equal to 11.5 sec to demonstrate improved functional strength and transfer efficiency.    Baseline 14.87    Time 5    Period Weeks    Status New      PT LONG TERM GOAL #3   Title Pt will stand at least 20 seconds on  foam surface, eyes closed, romberg stance, for improved vestibular system use for balance    Baseline < 10 sec    Time 5    Period Weeks    Status New      PT LONG TERM GOAL #4   Title Pt will verbalize understanding of Parkinson's disease related resources in local community.    Time 5    Period Weeks    Status New                   Plan - 04/04/21 1240     Clinical Impression Statement Treatment session consisted of initiating HEP of PWR! moves in sitting and Nustep for flexibility and strength and endurance.  Pt needs verbal cues for technique and to slow down movements with PWR! mvoes.  Wife observed session so she can assist pt at home.  Cont per poc.    Personal Factors and Comorbidities Comorbidity 1;Time since onset of injury/illness/exacerbation   newly diagnosed 03/2021 and started on Sinemet   Comorbidities PMH:  hx of back pain    Examination-Activity Limitations Locomotion Level;Transfers    Examination-Participation Restrictions Community Activity;Yard Work    Stability/Clinical Decision Making Stable/Uncomplicated    Rehab Potential Good    PT Frequency 2x / week   1x/wk for 1 week, then 2x/wk for 4 weeks   PT  Duration Other (comment)   5 weeks   PT Treatment/Interventions ADLs/Self Care Home Management;Neuromuscular re-education;Balance training;Therapeutic exercise;Therapeutic activities;Functional mobility training;Gait training;Patient/family education    PT Next Visit Plan Initiate PWR! Moves:  standing, sitting; vestibular balance/corner exercises for compliant surfaces.  Will need to discuss walking program, optimal fitness program for PD (at some point).  Need to ask about OT (we didn't discuss at eval, but I think given tremor LUE, he would benefit)    PT Home Exercise Plan PWR! moves sitting    Consulted and Agree with Plan of Care Patient;Family member/caregiver             Patient will benefit from skilled therapeutic intervention in order to improve the following deficits and impairments:  Abnormal gait, Difficulty walking, Decreased balance, Decreased strength, Postural dysfunction  Visit Diagnosis: Other abnormalities of gait and mobility  Unsteadiness on feet  Muscle weakness (generalized)  Other symptoms and signs involving the nervous system     Problem List Patient Active Problem List   Diagnosis Date Noted   Parkinson's disease (HCC) 03/22/2021   Newell Coral, PTA [facility] 04/04/21 12:44 PM Phone: [phone] Fax: [phone]   [facility] [address], [address] [place], [place], [address] Phone: [phone]   Fax:  [phone]  Name: Jason Herrera MRN: [id number] Date of Birth: [birth year]

## 2021-04-10 ENCOUNTER — Telehealth: Payer: Self-pay | Admitting: Physical Therapy

## 2021-04-10 ENCOUNTER — Encounter: Payer: Self-pay | Admitting: Physical Therapy

## 2021-04-10 ENCOUNTER — Other Ambulatory Visit: Payer: Self-pay

## 2021-04-10 ENCOUNTER — Ambulatory Visit: Payer: Medicare PPO | Attending: Neurology | Admitting: Physical Therapy

## 2021-04-10 DIAGNOSIS — R29898 Other symptoms and signs involving the musculoskeletal system: Secondary | ICD-10-CM | POA: Insufficient documentation

## 2021-04-10 DIAGNOSIS — M6281 Muscle weakness (generalized): Secondary | ICD-10-CM | POA: Insufficient documentation

## 2021-04-10 DIAGNOSIS — R251 Tremor, unspecified: Secondary | ICD-10-CM | POA: Insufficient documentation

## 2021-04-10 DIAGNOSIS — R2689 Other abnormalities of gait and mobility: Secondary | ICD-10-CM | POA: Diagnosis not present

## 2021-04-10 DIAGNOSIS — R29818 Other symptoms and signs involving the nervous system: Secondary | ICD-10-CM | POA: Insufficient documentation

## 2021-04-10 DIAGNOSIS — R278 Other lack of coordination: Secondary | ICD-10-CM | POA: Diagnosis not present

## 2021-04-10 DIAGNOSIS — R2681 Unsteadiness on feet: Secondary | ICD-10-CM | POA: Insufficient documentation

## 2021-04-10 DIAGNOSIS — G20 Parkinson's disease: Secondary | ICD-10-CM

## 2021-04-10 NOTE — Patient Instructions (Signed)
  Perform once daily 20 reps

## 2021-04-10 NOTE — Therapy (Signed)
[facility] [address] [place], [place], [address] Phone: [phone]   Fax:  [phone]  Physical Therapy Treatment  Patient Details  Name: Jason Herrera MRN: [id number] Date of Birth: 02/20/1951 Referring Provider (PT): Kerin Salen, DO   Encounter Date: 04/10/2021   PT End of Session - 04/10/21 1106     Visit Number 3    Number of Visits 10    Date for PT Re-Evaluation 05/04/21    Authorization Type [employer]    PT Start Time 1108    PT Stop Time 1149    PT Time Calculation (min) 41 min    Activity Tolerance Patient tolerated treatment well    Behavior During Therapy [facility] for tasks assessed/performed             Past Medical History:  Diagnosis Date   Back pain     Past Surgical History:  Procedure Laterality Date   HERNIA REPAIR      There were no vitals filed for this visit.   Subjective Assessment - 04/10/21 1106     Subjective Did the exercises at home and looked at the video; no problems.    Patient is accompained by: --    Patient Stated Goals Pt's goals for therapy are to minimizing impact of tremors.    Currently in Pain? No/denies                Pt performs PWR! Moves in seated position x 20 reps.  Pt return demo understanding.   PWR! Up for improved posture- cues for trunk positioning, to avoid posterior lean  PWR! Rock for improved weighshifting  PWR! Twist for improved trunk rotation   PWR! Step for improved step initiation   Rates effort with exercise as 4/10   Pt performs PWR! Moves in standing position x 10 reps   PWR! Up for improved posture  PWR! Rock for improved weighshifting  PWR! Twist for improved trunk rotation   PWR! Step for improved step initiation   Cues provided for technique and positioning of LUE  Rates 3/10 effort level         Gait training with arm swing, using bilateral walking poles to facilitate arm swing.  Pt tends to start  with same-side arm swing, and is able to switch to reciprocal with repetition.  With walking poles removed, pt goes back to same-side arm swing.  Performed gait training 50 ft x 8 reps.       [facility] Adult PT Treatment/Exercise - 04/10/21 0001       Self-Care   Self-Care Other Self-Care Comments    Other Self-Care Comments  Discussed benefits of OT with Parkinson's disease to assist with optimal positioning, help with tremor management, and avoid future complications.  Pt is open to the idea for OT, so PT will request order from MD.                     PT Education - 04/10/21 1142     Education Details PWR! Moves in standing added to HEP; benefits of OT and PT asking about requesting order for OT-pt agreeable    Person(s) Educated Patient    Methods Explanation;Demonstration;Handout    Comprehension Verbalized understanding;Returned demonstration;Need further instruction   to review next visit             PT Short Term Goals - 04/02/21 1722       PT SHORT TERM  GOAL #1   Title Pt will be independent with HEP for improved strength, balance, transfers, and gait.  TARGET 04/27/2021    Time 3    Period Weeks    Status New               PT Long Term Goals - 04/02/21 1718       PT LONG TERM GOAL #1   Title Pt will verbalize plans for ongoing fitness routine post therapy discharge to maximize gains made in PT.  TARGET 05/04/2021    Time 5    Period Weeks    Status New      PT LONG TERM GOAL #2   Title Pt will improve 5x sit<>stand to less than or equal to 11.5 sec to demonstrate improved functional strength and transfer efficiency.    Baseline 14.87    Time 5    Period Weeks    Status New      PT LONG TERM GOAL #3   Title Pt will stand at least 20 seconds on foam surface, eyes closed, romberg stance, for improved vestibular system use for balance    Baseline < 10 sec    Time 5    Period Weeks    Status New      PT LONG TERM GOAL #4   Title Pt will  verbalize understanding of Parkinson's disease related resources in local community.    Time 5    Period Weeks    Status New                   Plan - 04/10/21 1158     Clinical Impression Statement Reviewed seated PWR! Moves, with pt return demo understanding.  Initiated PWR! Moves in standing, with education in purpose of each PWR! Move in relation to function.  Pt needs reinforcement cues for technique and intensity.  Discussed possibility for occupational therapy to address LUE tremor and LUE positioning to avoid future complications and improve ADLs, hobbies.    Personal Factors and Comorbidities Comorbidity 1;Time since onset of injury/illness/exacerbation   newly diagnosed 03/2021 and started on Sinemet   Comorbidities PMH:  hx of back pain    Examination-Activity Limitations Locomotion Level;Transfers    Examination-Participation Restrictions Community Activity;Yard Work    Stability/Clinical Decision Making Stable/Uncomplicated    Rehab Potential Good    PT Frequency 2x / week   1x/wk for 1 week, then 2x/wk for 4 weeks   PT Duration Other (comment)   5 weeks   PT Treatment/Interventions ADLs/Self Care Home Management;Neuromuscular re-education;Balance training;Therapeutic exercise;Therapeutic activities;Functional mobility training;Gait training;Patient/family education    PT Next Visit Plan Review PWR! Moves in standing; vestibular balance/corner exercises for compliant surfaces.  Will need to discuss walking program, optimal fitness program for PD (at some point).  PT to request OT order    PT Home Exercise Plan PWR! moves sitting; standing    Recommended Other Services Ask Dr. Arbutus Leas about OT order    Consulted and Agree with Plan of Care Patient             Patient will benefit from skilled therapeutic intervention in order to improve the following deficits and impairments:  Abnormal gait, Difficulty walking, Decreased balance, Decreased strength, Postural  dysfunction  Visit Diagnosis: Unsteadiness on feet  Other symptoms and signs involving the nervous system  Muscle weakness (generalized)  Other abnormalities of gait and mobility     Problem List Patient Active Problem List   Diagnosis Date  Noted   Parkinson's disease (HCC) 03/22/2021    Aliyanna Wassmer W., PT 04/10/2021, 12:08 PM  [facility] [address] [place], [place], [address] Phone: [phone]   Fax:  [phone]  Name: CHAYNE BAUMGART MRN: [id number] Date of Birth: 29-Dec-1950

## 2021-04-10 NOTE — Telephone Encounter (Signed)
Mr. Beazer was evaluated by PT last week following his new diagnosis of Parkinson's disease.  With LUE tremor being a predominant symptom, he would benefit from occupational therapy to address UE bradykinesia and tremor that is affecting ADLs, coordination and fine motor skills.  If you agree, please write and send OT order for OT eval and treat.  Thank you.  Lonia Blood, PT 04/10/21 3:32 PM [facility] [address] [place], [address] [place], [place]  [address] Phone: [phone] Fax: [phone]

## 2021-04-12 ENCOUNTER — Other Ambulatory Visit: Payer: Self-pay

## 2021-04-12 ENCOUNTER — Ambulatory Visit: Payer: Medicare PPO | Admitting: Physical Therapy

## 2021-04-12 DIAGNOSIS — R2689 Other abnormalities of gait and mobility: Secondary | ICD-10-CM | POA: Diagnosis not present

## 2021-04-12 DIAGNOSIS — R278 Other lack of coordination: Secondary | ICD-10-CM | POA: Diagnosis not present

## 2021-04-12 DIAGNOSIS — R251 Tremor, unspecified: Secondary | ICD-10-CM | POA: Diagnosis not present

## 2021-04-12 DIAGNOSIS — R29818 Other symptoms and signs involving the nervous system: Secondary | ICD-10-CM | POA: Diagnosis not present

## 2021-04-12 DIAGNOSIS — R29898 Other symptoms and signs involving the musculoskeletal system: Secondary | ICD-10-CM | POA: Diagnosis not present

## 2021-04-12 DIAGNOSIS — R2681 Unsteadiness on feet: Secondary | ICD-10-CM

## 2021-04-12 DIAGNOSIS — M6281 Muscle weakness (generalized): Secondary | ICD-10-CM | POA: Diagnosis not present

## 2021-04-12 NOTE — Patient Instructions (Signed)

## 2021-04-13 NOTE — Therapy (Signed)
[facility] [address] [place], [place], [address] Phone: [phone]   Fax:  [phone]  Physical Therapy Treatment  Patient Details  Name: Jason Herrera MRN: [id number] Date of Birth: 11-Apr-1951 Referring Provider (PT): Kerin Salen, DO   Encounter Date: 04/12/2021   PT End of Session - 04/12/21 1107     Visit Number 4    Number of Visits 10    Date for PT Re-Evaluation 05/04/21    Authorization Type [employer]    PT Start Time 1106    PT Stop Time 1146    PT Time Calculation (min) 40 min    Activity Tolerance Patient tolerated treatment well    Behavior During Therapy [facility] for tasks assessed/performed             Past Medical History:  Diagnosis Date   Back pain     Past Surgical History:  Procedure Laterality Date   HERNIA REPAIR      There were no vitals filed for this visit.   Subjective Assessment - 04/12/21 1107     Subjective No changes since last visit.  Doing the exercises, still need the sheet.    Patient Stated Goals Pt's goals for therapy are to minimizing impact of tremors.    Currently in Pain? No/denies              Neuro Re-education   Reviewed PWR! Moves in standing position x 10 reps   PWR! Up for improved posture   PWR! Rock for improved weighshifting   PWR! Twist for improved trunk rotation    PWR! Step for improved step initiation    Cues provided for technique and positioning of LUE   Rates 5/10 effort level with standing PWR! Moves today.  Pt return demonstrating understanding of exercises.     Pt performs PWR! Moves in supine position x 10 reps   PWR! Up for improved posture-through shoulders x 10, through hips x 10   PWR! Rock for improved weighshifting   PWR! Twist for improved trunk rotation    PWR! Step for improved step initiation   Verbal, visual, and tactile cues provided for technique and intensity.          Forward step and  weightshift with coordinated UE moves, x 10 reps  Back step and weigthshift with coordinated UE moves, x 10 reps        [facility] Adult PT Treatment/Exercise - 04/12/21 1105       Ambulation/Gait   Ambulation/Gait Yes    Ambulation/Gait Assistance 6: Modified independent (Device/Increase time)    Ambulation Distance (Feet) 600 Feet   outdoors, 200 ft indoors   Assistive device None    Gait Pattern Step-through pattern;Decreased arm swing - left;Decreased trunk rotation    Ambulation Surface Level;Indoor;Unlevel;Outdoor    Gait Comments Initial cues for reciprocal arm swing, with pt needing several times of reinforcement, then able to keep reciprocal arm swing.  Cues for posture, arm swing, step length; performed gait about 5 minutes and discussed trying walking in home or outdoors starting 5-10 minutes and working up towards 15-20 minutes, 3-5 times/wk      Self-Care   Self-Care Other Self-Care Comments    Other Self-Care Comments  Discussed optimal PD fitness upon d/c from PT, inlcuding gait, aerobic activity, and PWR! Moves exercises.                     PT Education -  04/12/21 1141     Education Details Optimal fitness program for people with Parkinson's -focus on PWR! Moves and walking              PT Short Term Goals - 04/02/21 1722       PT SHORT TERM GOAL #1   Title Pt will be independent with HEP for improved strength, balance, transfers, and gait.  TARGET 04/27/2021    Time 3    Period Weeks    Status New               PT Long Term Goals - 04/02/21 1718       PT LONG TERM GOAL #1   Title Pt will verbalize plans for ongoing fitness routine post therapy discharge to maximize gains made in PT.  TARGET 05/04/2021    Time 5    Period Weeks    Status New      PT LONG TERM GOAL #2   Title Pt will improve 5x sit<>stand to less than or equal to 11.5 sec to demonstrate improved functional strength and transfer efficiency.    Baseline 14.87    Time  5    Period Weeks    Status New      PT LONG TERM GOAL #3   Title Pt will stand at least 20 seconds on foam surface, eyes closed, romberg stance, for improved vestibular system use for balance    Baseline < 10 sec    Time 5    Period Weeks    Status New      PT LONG TERM GOAL #4   Title Pt will verbalize understanding of Parkinson's disease related resources in local community.    Time 5    Period Weeks    Status New                   Plan - 04/13/21 1055     Clinical Impression Statement Reviewed standing PWR! Moves with pt return demo understanding.  Worked on supine PWR! Moves, however, pt does not report having trouble with bed mobility and may not need this set of PWR! Moves at this time.  Worked on forward/back step and weightshift as well as gait training focusing on posture, arm swing, step length, with informaiton to include walking as part of HEP.  He will conitnue to benefit from skilled PT to address posture, balance, gait for improved overall functional mobility.    Personal Factors and Comorbidities Comorbidity 1;Time since onset of injury/illness/exacerbation   newly diagnosed 03/2021 and started on Sinemet   Comorbidities PMH:  hx of back pain    Examination-Activity Limitations Locomotion Level;Transfers    Examination-Participation Restrictions Community Activity;Yard Work    Stability/Clinical Decision Making Stable/Uncomplicated    Rehab Potential Good    PT Frequency 2x / week   1x/wk for 1 week, then 2x/wk for 4 weeks   PT Duration Other (comment)   5 weeks   PT Treatment/Interventions ADLs/Self Care Home Management;Neuromuscular re-education;Balance training;Therapeutic exercise;Therapeutic activities;Functional mobility training;Gait training;Patient/family education    PT Next Visit Plan Review PWR! Moves in quadruped discuss if needed at home; vestibular balance/corner exercises for compliant surfaces.  Discuss walking program, and discuss aerobic  activity-trial Nustep or bike in session.  At some point-provide info on PD cycling class and PWR! Moves classes    PT Home Exercise Plan PWR! moves sitting; standing    Consulted and Agree with Plan of Care Patient  Patient will benefit from skilled therapeutic intervention in order to improve the following deficits and impairments:  Abnormal gait, Difficulty walking, Decreased balance, Decreased strength, Postural dysfunction  Visit Diagnosis: Other symptoms and signs involving the nervous system  Unsteadiness on feet  Other abnormalities of gait and mobility     Problem List Patient Active Problem List   Diagnosis Date Noted   Parkinson's disease (HCC) 03/22/2021    Caine Barfield W., PT 04/13/2021, 10:59 AM  [facility] [address] [place], [place], [address] Phone: [phone]   Fax:  [phone]  Name: ERASTUS BARTOLOMEI MRN: [id number] Date of Birth: May 15, 1950

## 2021-04-17 ENCOUNTER — Other Ambulatory Visit: Payer: Self-pay

## 2021-04-17 ENCOUNTER — Encounter: Payer: Self-pay | Admitting: Occupational Therapy

## 2021-04-17 ENCOUNTER — Ambulatory Visit: Payer: Medicare PPO | Admitting: Physical Therapy

## 2021-04-17 ENCOUNTER — Encounter: Payer: Self-pay | Admitting: Physical Therapy

## 2021-04-17 ENCOUNTER — Ambulatory Visit: Payer: Medicare PPO | Admitting: Occupational Therapy

## 2021-04-17 DIAGNOSIS — R2689 Other abnormalities of gait and mobility: Secondary | ICD-10-CM | POA: Diagnosis not present

## 2021-04-17 DIAGNOSIS — R278 Other lack of coordination: Secondary | ICD-10-CM | POA: Diagnosis not present

## 2021-04-17 DIAGNOSIS — R29818 Other symptoms and signs involving the nervous system: Secondary | ICD-10-CM

## 2021-04-17 DIAGNOSIS — R251 Tremor, unspecified: Secondary | ICD-10-CM | POA: Diagnosis not present

## 2021-04-17 DIAGNOSIS — R2681 Unsteadiness on feet: Secondary | ICD-10-CM

## 2021-04-17 DIAGNOSIS — M6281 Muscle weakness (generalized): Secondary | ICD-10-CM

## 2021-04-17 DIAGNOSIS — R29898 Other symptoms and signs involving the musculoskeletal system: Secondary | ICD-10-CM

## 2021-04-17 NOTE — Therapy (Signed)
[facility] [address] [place], [place], [address] Phone: [phone]   Fax:  [phone]  Physical Therapy Treatment  Patient Details  Name: Jason Herrera MRN: [id number] Date of Birth: [birth year] Referring Provider (PT): Kerin Salen, DO   Encounter Date: 04/17/2021   PT End of Session - 04/17/21 1155     Visit Number 5    Number of Visits 10    Date for PT Re-Evaluation 05/04/21    Authorization Type [employer]    PT Start Time 1102    PT Stop Time 1145    PT Time Calculation (min) 43 min    Activity Tolerance Patient tolerated treatment well    Behavior During Therapy [facility] for tasks assessed/performed             Past Medical History:  Diagnosis Date   Back pain     Past Surgical History:  Procedure Laterality Date   HERNIA REPAIR      There were no vitals filed for this visit.   Subjective Assessment - 04/17/21 1106     Subjective Denies any falls or changes since last visit.  Feels like the tremors at rest might be better.    Patient Stated Goals Pt's goals for therapy are to minimizing impact of tremors.    Currently in Pain? No/denies                               [facility] Adult PT Treatment/Exercise - 04/17/21 0001       Knee/Hip Exercises: Aerobic   Nustep Level 6 all 4 extremities x 9 minutes with SPM>76.  30 bouts of increased intensity with SPM>100. Pt reports intensity of 7-8/10 with increased intensity bouts.               PWR [facility]) - 04/17/21 1128     PWR! exercises Moves in [place]! Up 20    PWR! Rock 20    PWR! Twist 20    PWR! Step 20    Comments First introduction to PWR! quad.  Provided as HEP and used blue mat in clinic.  Pt did have difficulty with quadruped>standing after exercises with minimal LOB.                  PT Education - 04/17/21 1154     Education Details PWR!moves class, PD cycling, optimal community fitness,  possibility of scheduling additional visits    Person(s) Educated Patient    Methods Explanation;Demonstration;Verbal cues;Handout    Comprehension Verbalized understanding;Need further instruction              PT Short Term Goals - 04/02/21 1722       PT SHORT TERM GOAL #1   Title Pt will be independent with HEP for improved strength, balance, transfers, and gait.  TARGET 04/27/2021    Time 3    Period Weeks    Status New               PT Long Term Goals - 04/02/21 1718       PT LONG TERM GOAL #1   Title Pt will verbalize plans for ongoing fitness routine post therapy discharge to maximize gains made in PT.  TARGET 05/04/2021    Time 5    Period Weeks    Status New      PT LONG TERM GOAL #2  Title Pt will improve 5x sit<>stand to less than or equal to 11.5 sec to demonstrate improved functional strength and transfer efficiency.    Baseline 14.87    Time 5    Period Weeks    Status New      PT LONG TERM GOAL #3   Title Pt will stand at least 20 seconds on foam surface, eyes closed, romberg stance, for improved vestibular system use for balance    Baseline < 10 sec    Time 5    Period Weeks    Status New      PT LONG TERM GOAL #4   Title Pt will verbalize understanding of Parkinson's disease related resources in local community.    Time 5    Period Weeks    Status New                   Plan - 04/17/21 1156     Clinical Impression Statement Initiated PWR! moves all 4's today.  Pt wanting to check with his wife on scheduling additional visits at this time and she is not present today.  Pt reports difficulty with getting up from floor at home and states he needs to use UE support.  Pt progressing toward goals.  Cont per poc.    Personal Factors and Comorbidities Comorbidity 1;Time since onset of injury/illness/exacerbation   newly diagnosed 03/2021 and started on Sinemet   Comorbidities PMH:  hx of back pain    Examination-Activity Limitations  Locomotion Level;Transfers    Examination-Participation Restrictions Community Activity;Yard Work    Stability/Clinical Decision Making Stable/Uncomplicated    Rehab Potential Good    PT Frequency 2x / week   1x/wk for 1 week, then 2x/wk for 4 weeks   PT Duration Other (comment)   5 weeks   PT Treatment/Interventions ADLs/Self Care Home Management;Neuromuscular re-education;Balance training;Therapeutic exercise;Therapeutic activities;Functional mobility training;Gait training;Patient/family education    PT Next Visit Plan Was he able to check with wife about scheduling additional visits?  Check goals is pt plans on next session being his d/c day.  If not, add additional visits.  Vestibular balance/corner exercises for compliant surfaces.    PT Home Exercise Plan PWR! moves sitting; standing, all 4's    Consulted and Agree with Plan of Care Patient             Patient will benefit from skilled therapeutic intervention in order to improve the following deficits and impairments:  Abnormal gait, Difficulty walking, Decreased balance, Decreased strength, Postural dysfunction  Visit Diagnosis: Other abnormalities of gait and mobility  Other symptoms and signs involving the nervous system  Unsteadiness on feet     Problem List Patient Active Problem List   Diagnosis Date Noted   Parkinson's disease (HCC) 03/22/2021   Newell Coral, PTA [facility] 04/17/21 12:00 PM Phone: [phone] Fax: [phone]   [facility] [address] [place], [place], [address] Phone: [phone]   Fax:  [phone]  Name: Jason Herrera MRN: [id number] Date of Birth: 1951-02-17

## 2021-04-17 NOTE — Patient Instructions (Signed)

## 2021-04-17 NOTE — Addendum Note (Signed)
Addended by: Dillard Essex on: 04/17/2021 04:07 PM   Modules accepted: Orders

## 2021-04-17 NOTE — Therapy (Signed)
[facility] [address], [address] [place], [place], [address] Phone: [phone]   Fax:  [phone]  Occupational Therapy Evaluation  Patient Details  Name: Jason Herrera MRN: [id number] Date of Birth: [birth year] Referring Provider (OT): Tat, Octaviano Batty, DO   Encounter Date: 04/17/2021   OT End of Session - 04/17/21 1534     Visit Number 1    Number of Visits 17    Date for OT Re-Evaluation 06/12/21    Authorization Type [employer] Medicare    OT Start Time 1150    OT Stop Time 1236    OT Time Calculation (min) 46 min    Activity Tolerance Patient tolerated treatment well    Behavior During Therapy [facility] for tasks assessed/performed             Past Medical History:  Diagnosis Date   Back pain     Past Surgical History:  Procedure Laterality Date   HERNIA REPAIR      There were no vitals filed for this visit.   Subjective Assessment - 04/17/21 1523     Subjective  Pt reports noticing increased tremors in LLE and LUE when completing extended tasks.  Saw Dr. Arbutus Leas and she thinks that therapy may be helpful.    Patient Stated Goals To learn coping skills and techniques to decrease or distract from tremors    Currently in Pain? No/denies               [facility] OT Assessment - 04/17/21 1201       Assessment   Medical Diagnosis Parkinson's disease    Referring Provider (OT) Tat, Octaviano Batty, DO    Onset Date/Surgical Date 03/22/21   MD visit   Hand Dominance Right    Next MD Visit 09/06/2021    Prior Therapy recently started PT      Precautions   Precautions Fall      Balance Screen   Has the patient fallen in the past 6 months No    Has the patient had a decrease in activity level because of a fear of falling?  No    Is the patient reluctant to leave their home because of a fear of falling?  No      Home  Environment   Family/patient expects to be discharged to: Private residence    Living Arrangements  Spouse/significant other    Available Help at Discharge Available 24 hours/day    Type of Home House    Home Access Stairs   step over threshold, 2-3 steps to side porch, 7 steps to back porch   Home Layout Able to live on main level with bedroom/bathroom    Bathroom Shower/Tub Tub/Shower unit;Curtain    Firefighter --   comfort height   Bathroom Accessibility Yes    Home Equipment Hand held shower head    Lives With Spouse      Prior Function   Level of [place] Independent    Vocation Retired    Leisure Helping son renovating house, Presenter, broadcasting; has part-time pool business in summers      ADL   ADL comments Independent with all bathing/dressing, transfers, eating, grooming.      IADL   Shopping Shops independently for [employer] --   completes vacuuming, wife does majority of housekeeping tasks   Meal Prep Plans, prepares and serves adequate meals independently   wife does majority of meal prep  Community Mobility Drives own vehicle    Medication Management Is responsible for taking medication in correct dosages at correct time    Development worker, community financial matters independently (budgets, writes checks, pays rent, bills goes to bank), collects and keeps track of income      Written Expression   Dominant Hand Right    Handwriting 100% legible      Vision - History   Baseline Vision Wears glasses all the time   doesn't wear for tv, but does for driving and reading     Vision Assessment   Eye Alignment Within Functional Limits      Cognition   Overall Cognitive Status Within Functional Limits for tasks assessed      Observation/Other Assessments   Simulated Eating Time (seconds) 13.21    Donning Doffing Jacket Time (seconds) buttons: 34.53      Sensation   Light Touch Appears Intact    Proprioception Appears Intact      Coordination   Gross Motor Movements are Fluid and Coordinated Yes    Fine Motor Movements are Fluid and  Coordinated No    Coordination and Movement Description mild tremors with activity with LUE, noted resting tremors in L as well    Finger Nose Finger Test mild tremor in LUE, WNL RUE    9 Hole Peg Test --   R: 24.03 and L: 26.72   Box and Blocks R: 62 and L: 54    Tremors resting tremor noted in LUE and LLE, mild L tremor with FMC tasks      Tone   Assessment Location Right Upper Extremity;Left Upper Extremity      ROM / Strength   AROM / PROM / Strength AROM;Strength      AROM   AROM Assessment Site Shoulder;Elbow    Right/Left Shoulder Right;Left    Right Shoulder Flexion 142 Degrees    Left Shoulder Flexion 135 Degrees    Right/Left Elbow Right;Left    Right Elbow Extension 0    Left Elbow Extension -10      Strength   Overall Strength Within functional limits for tasks performed    Overall Strength Comments grossly tested at least 4+/5 BUEs      Hand Function   Right Hand Grip (lbs) 101    Right Hand Lateral Pinch 27 lbs    Right Hand 3 Point Pinch 21 lbs    Left Hand Grip (lbs) 87    Left Hand Lateral Pinch 24 lbs    Left 3 point pinch 15 lbs      RUE Tone   RUE Tone Within Functional Limits      LUE Tone   LUE Tone Mild                              OT Education - 04/17/21 1522     Education Details initiated education on large amplitude movements    Person(s) Educated Patient    Methods Explanation    Comprehension Need further instruction              OT Short Term Goals - 04/17/21 1544       OT SHORT TERM GOAL #1   Title Pt will demonstrate improved ease with fastening buttons as evidenced by decreasing 3 button/ unbutton time to 0:30 seconds    Baseline 0:34.53 sec    Time 4    Period Weeks  Status New    Target Date 05/15/21      OT SHORT TERM GOAL #2   Title Pt will demonstrate improved UE functional use for ADLs as evidenced by increasing box/ blocks score by 4 blocks with LUE    Baseline L:54 and R: 62    Time 8     Period Weeks    Status New      OT SHORT TERM GOAL #3   Title Pt will verbalize understanding of ways to prevent future PD related complications and PD community resources.    Time 8    Period Weeks    Status New               OT Long Term Goals - 04/17/21 1543       OT LONG TERM GOAL #1   Title I with PD specific HEP    Time 8    Period Weeks    Status New    Target Date 06/12/21      OT LONG TERM GOAL #2   Title Pt will demonstrate ability to retrieve a lightweight object at 140* shoulder flexion and -5* elbow extension with LUE to increase shoulder ROM as needed for ADLs/IADLs.    Baseline 135* and -10*    Time 8    Period Weeks    Status New      OT LONG TERM GOAL #3   Title Pt will verbalize understanding of adapted strategies to maximize safety and I with ADLs/ IADLs.    Time 8    Period Weeks    Status New                   Plan - 04/17/21 1535     Clinical Impression Statement Pt is a 70 y/o male who presents to OP OT due to increased tremors at rest in LUE/LLE also mild tremors and decreased coordination in LUE with FMC tasks, decreased L shoulder and elbow ROM, and decreased strength in L hand compared to R. Pt currently lives with wife and is retired, but currently helping son renovate home. Pt will benefit from skilled occupational therapy services to address strength and coordination, ROM, pain management, balance, GM/FM control, introduction of compensatory strategies/AE prn, and implementation of an HEP to improve participation and safety during ADLs, IADLs, and quality of living.    OT Occupational Profile and History Detailed Assessment- Review of Records and additional review of physical, cognitive, psychosocial history related to current functional performance    Occupational performance deficits (Please refer to evaluation for details): ADL's;IADL's;Leisure    Body Structure / Function / Physical Skills ADL;Balance;Body  mechanics;Coordination;Decreased knowledge of precautions;Decreased knowledge of use of DME;Endurance;Flexibility;FMC;GMC;IADL;Mobility;Pain;ROM;Strength;Tone;UE functional use    Rehab Potential Good    Clinical Decision Making Limited treatment options, no task modification necessary    Comorbidities Affecting Occupational Performance: May have comorbidities impacting occupational performance    Modification or Assistance to Complete Evaluation  No modification of tasks or assist necessary to complete eval    OT Frequency 2x / week    OT Duration 8 weeks    OT Treatment/Interventions Self-care/ADL training;Cryotherapy;Electrical Stimulation;Moist Heat;Ultrasound;Iontophoresis;Therapeutic exercise;Neuromuscular education;Energy conservation;DME and/or AE instruction;Functional Mobility Training;Manual Therapy;Passive range of motion;Therapeutic activities;Cognitive remediation/compensation;Patient/family education;Balance training;Coping strategies training    Plan big amplitude movements, L shoulder ROM, strength, GMC/FMC    Consulted and Agree with Plan of Care Patient             Patient will benefit from  skilled therapeutic intervention in order to improve the following deficits and impairments:   Body Structure / Function / Physical Skills: ADL, Balance, Body mechanics, Coordination, Decreased knowledge of precautions, Decreased knowledge of use of DME, Endurance, Flexibility, FMC, GMC, IADL, Mobility, Pain, ROM, Strength, Tone, UE functional use       Visit Diagnosis: Other symptoms and signs involving the nervous system  Unsteadiness on feet  Muscle weakness (generalized)  Other lack of coordination  Tremor  Other symptoms and signs involving the musculoskeletal system    Problem List Patient Active Problem List   Diagnosis Date Noted   Parkinson's disease (HCC) 03/22/2021    Rosalio Loud, OT 04/17/2021, 3:53 PM  [facility] [address], [address] [place], [place], [address] Phone: [phone]   Fax:  [phone]  Name: Jason Herrera MRN: [id number] Date of Birth: 03-12-1951

## 2021-04-19 ENCOUNTER — Other Ambulatory Visit: Payer: Self-pay

## 2021-04-19 ENCOUNTER — Encounter: Payer: Self-pay | Admitting: Physical Therapy

## 2021-04-19 ENCOUNTER — Ambulatory Visit: Payer: Medicare PPO | Admitting: Physical Therapy

## 2021-04-19 ENCOUNTER — Ambulatory Visit: Payer: Medicare PPO | Admitting: Occupational Therapy

## 2021-04-19 DIAGNOSIS — M6281 Muscle weakness (generalized): Secondary | ICD-10-CM

## 2021-04-19 DIAGNOSIS — R278 Other lack of coordination: Secondary | ICD-10-CM

## 2021-04-19 DIAGNOSIS — R2681 Unsteadiness on feet: Secondary | ICD-10-CM

## 2021-04-19 DIAGNOSIS — R251 Tremor, unspecified: Secondary | ICD-10-CM

## 2021-04-19 DIAGNOSIS — R29898 Other symptoms and signs involving the musculoskeletal system: Secondary | ICD-10-CM

## 2021-04-19 DIAGNOSIS — R29818 Other symptoms and signs involving the nervous system: Secondary | ICD-10-CM

## 2021-04-19 DIAGNOSIS — R2689 Other abnormalities of gait and mobility: Secondary | ICD-10-CM | POA: Diagnosis not present

## 2021-04-19 NOTE — Patient Instructions (Signed)
Coordination Exercises  Perform the following exercises for 10-15 minutes 1 times per day. Perform with both hand(s). Perform using big movements.  Flipping Cards: Place deck of cards on the table. Flip cards over by opening your hand big to grasp and then turn your palm up big. Deal cards: Hold 1/2 or whole deck in your hand. Use thumb to push card off top of deck with one big push. Rotate ball with fingertips: Pick up with fingers/thumb and move as much as you can with each turn/movement (clockwise and counter-clockwise). Toss ball from one hand to the other: Toss big/high. Toss ball in the air and catch with the same hand: Toss big/high. Pick up coins and place in coin bank or container: Pick up with big, intentional movements. Do not drag coin to the edge. Pick up coins and stack one at a time: Pick up with big, intentional movements. Do not drag coin to the edge. (5-10 in a stack) Pick up 5-10 coins one at a time and hold in palm. Then, move coins from palm to fingertips one at time and place in coin bank/container. Perform "Flicks"/hand stretches (PWR! Hands): Close hands then flick out your fingers with focus on opening hands, pulling wrists back, and extending elbows like you are pushing.

## 2021-04-19 NOTE — Therapy (Signed)
[facility] [address] [place], [place], [address] Phone: [phone]   Fax:  [phone]  Occupational Therapy Treatment  Patient Details  Name: Jason Herrera MRN: [id number] Date of Birth: 1950-12-09 Referring Provider (OT): Tat, Octaviano Batty, DO   Encounter Date: 04/19/2021   OT End of Session - 04/19/21 1217     Visit Number 2    Number of Visits 17    Date for OT Re-Evaluation 06/12/21    Authorization Type [employer]    Authorization Time Period 16 visits    Authorization - Visit Number 1    Authorization - Number of Visits 16    Progress Note Due on Visit 10    OT Start Time 1150    OT Stop Time 1232    OT Time Calculation (min) 42 min    Activity Tolerance Patient tolerated treatment well    Behavior During Therapy [facility] for tasks assessed/performed             Past Medical History:  Diagnosis Date   Back pain     Past Surgical History:  Procedure Laterality Date   HERNIA REPAIR      There were no vitals filed for this visit.   Subjective Assessment - 04/19/21 1153     Subjective  Pt reports having a "tumble" when getting off floor mat during PT session, but reports not pain.    Patient Stated Goals To learn coping skills and techniques to decrease or distract from tremors    Currently in Pain? No/denies                [facility] and [facility] with focus on large amplitude movements with flipping cards, ball toss and rotation in finger tips, and coin activities with picking up and stacking and completing in-hand manipulation and translation of coins.  Therapist providing cues for open hand and large amplitude movements.  Noted resting tremors in LLE during table top activities, little to no tremors noted in LUE during above activities.  Dynamic standing and reaching task with focus on large amplitude movements while reaching in to cabinet and trunk rotation to place cones on table.   Pt able to reach  into cabinet at mid to high range with no reports of pain or stiffness in L shoulder.  Therapist providing cues for large amplitude movement and opening hand when picking up and releasing cones.  Cues to look at item when picking up and releasing to ensure proper technique to facilitate trunk rotation and decrease risk of shoulder injury.                    OT Short Term Goals - 04/19/21 1217       OT SHORT TERM GOAL #1   Title Pt will demonstrate improved ease with fastening buttons as evidenced by decreasing 3 button/ unbutton time to 0:30 seconds    Baseline 0:34.53 sec    Time 4    Period Weeks    Status On-going    Target Date 05/15/21      OT SHORT TERM GOAL #2   Title Pt will demonstrate improved UE functional use for ADLs as evidenced by increasing box/ blocks score by 4 blocks with LUE    Baseline L:54 and R: 62    Time 8    Period Weeks    Status On-going      OT SHORT TERM GOAL #3   Title  Pt will verbalize understanding of ways to prevent future PD related complications and PD community resources.    Time 8    Period Weeks    Status On-going               OT Long Term Goals - 04/19/21 1217       OT LONG TERM GOAL #1   Title I with PD specific HEP    Time 8    Period Weeks    Status On-going    Target Date 06/12/21      OT LONG TERM GOAL #2   Title Pt will demonstrate ability to retrieve a lightweight object at 140* shoulder flexion and -5* elbow extension with LUE to increase shoulder ROM as needed for ADLs/IADLs.    Baseline 135* and -10*    Time 8    Period Weeks    Status On-going      OT LONG TERM GOAL #3   Title Pt will verbalize understanding of adapted strategies to maximize safety and I with ADLs/ IADLs.    Time 8    Period Weeks    Status On-going                   Plan - 04/19/21 1217     Clinical Impression Statement Pt reponded well to education of big amplitude movements with fine and gross motor tasks this  session.  Pt very motivated to work through tremors and keep them at bay.    OT Occupational Profile and History Detailed Assessment- Review of Records and additional review of physical, cognitive, psychosocial history related to current functional performance    Occupational performance deficits (Please refer to evaluation for details): ADL's;IADL's;Leisure    Body Structure / Function / Physical Skills ADL;Balance;Body mechanics;Coordination;Decreased knowledge of precautions;Decreased knowledge of use of DME;Endurance;Flexibility;FMC;GMC;IADL;Mobility;Pain;ROM;Strength;Tone;UE functional use    Rehab Potential Good    Clinical Decision Making Limited treatment options, no task modification necessary    Comorbidities Affecting Occupational Performance: May have comorbidities impacting occupational performance    Modification or Assistance to Complete Evaluation  No modification of tasks or assist necessary to complete eval    OT Frequency 2x / week    OT Duration 8 weeks    OT Treatment/Interventions Self-care/ADL training;Cryotherapy;Electrical Stimulation;Moist Heat;Ultrasound;Iontophoresis;Therapeutic exercise;Neuromuscular education;Energy conservation;DME and/or AE instruction;Functional Mobility Training;Manual Therapy;Passive range of motion;Therapeutic activities;Cognitive remediation/compensation;Patient/family education;Balance training;Coping strategies training    Plan big amplitude movements, L shoulder ROM, strength, GMC/FMC    OT Home Exercise Plan Coordination exercises with focus on big movement    Consulted and Agree with Plan of Care Patient             Patient will benefit from skilled therapeutic intervention in order to improve the following deficits and impairments:   Body Structure / Function / Physical Skills: ADL, Balance, Body mechanics, Coordination, Decreased knowledge of precautions, Decreased knowledge of use of DME, Endurance, Flexibility, FMC, GMC, IADL,  Mobility, Pain, ROM, Strength, Tone, UE functional use       Visit Diagnosis: Muscle weakness (generalized)  Unsteadiness on feet  Other symptoms and signs involving the nervous system  Other lack of coordination  Tremor  Other symptoms and signs involving the musculoskeletal system    Problem List Patient Active Problem List   Diagnosis Date Noted   Parkinson's disease (HCC) 03/22/2021    Rosalio Loud, OT 04/19/2021, 1:59 PM  [facility] [address], [address] [place], [place],  [address] Phone: [phone]   Fax:  [phone]  Name: ZALEN SEQUEIRA MRN: [id number] Date of Birth: 01-05-1951

## 2021-04-19 NOTE — Therapy (Signed)
[facility] [address], [address] [place], [place], [address] Phone: [phone]   Fax:  [phone]  Physical Therapy Treatment  Patient Details  Name: Jason Herrera MRN: [id number] Date of Birth: 07/11/1950 Referring Provider (PT): Kerin Salen, DO   Encounter Date: 04/19/2021   PT End of Session - 04/19/21 1154     Visit Number 6    Number of Visits 10    Date for PT Re-Evaluation 05/04/21    Authorization Type [employer]    PT Start Time 1101    PT Stop Time 1144    PT Time Calculation (min) 43 min    Activity Tolerance Patient tolerated treatment well    Behavior During Therapy [facility] for tasks assessed/performed             Past Medical History:  Diagnosis Date   Back pain     Past Surgical History:  Procedure Laterality Date   HERNIA REPAIR      There were no vitals filed for this visit.   Subjective Assessment - 04/19/21 1105     Subjective Went to the presentation yesterday and learned some information.    Patient Stated Goals Pt's goals for therapy are to minimizing impact of tremors.    Currently in Pain? No/denies                               [facility] Adult PT Treatment/Exercise - 04/19/21 0001       Self-Care   Self-Care Other Self-Care Comments    Other Self-Care Comments  Discussed POC and scheduling more appointments; pt is agreeable to work for several more weeks towards goals.  Discussed options for aerobic activity, including use of machines at [facility] or PD cycling class as options.      Knee/Hip Exercises: Aerobic   Nustep Level 6 all 4 extremities x 8 minutes with SPM>80.  15-30 sec bouts of increased intensity with SPM>130. Pt reports intensity of 7-8/10 with increased intensity bouts.  O2 sats 96%, HR 110 bpm            Pt performs PWR! Moves in quadruped position, on blue compliant mat in gym   PWR! Up for improved posture x 20 reps  PWR! Rock for improved  weighshifting x 20 reps    PWR! Twist for improved trunk rotation x 20 reps  PWR! Step for improved step initiation-attempted x 1 with loss of balance going from quadruped to 1/2 kneel position (pt quickly transitions without UE support and loses balance to R sidesit position).  Discussed holding and not performing these at home until we practice more in therapy sessions, as pt may need light UE support for more stable transition from quadruped to 1/2 kneel position.  Visual and verbal cues provided for technique.  Discussed rationale for quadruped PWR! Moves in relation to functional activities.          PT Education - 04/19/21 1154     Education Details Continue PWR! Moves seated and standing at home; hold on quadruped position until we try again in clinic.    Person(s) Educated Patient    Methods Explanation    Comprehension Verbalized understanding              PT Short Term Goals - 04/02/21 1722       PT SHORT TERM GOAL #1   Title Pt will be independent with  HEP for improved strength, balance, transfers, and gait.  TARGET 04/27/2021    Time 3    Period Weeks    Status New               PT Long Term Goals - 04/02/21 1718       PT LONG TERM GOAL #1   Title Pt will verbalize plans for ongoing fitness routine post therapy discharge to maximize gains made in PT.  TARGET 05/04/2021    Time 5    Period Weeks    Status New      PT LONG TERM GOAL #2   Title Pt will improve 5x sit<>stand to less than or equal to 11.5 sec to demonstrate improved functional strength and transfer efficiency.    Baseline 14.87    Time 5    Period Weeks    Status New      PT LONG TERM GOAL #3   Title Pt will stand at least 20 seconds on foam surface, eyes closed, romberg stance, for improved vestibular system use for balance    Baseline < 10 sec    Time 5    Period Weeks    Status New      PT LONG TERM GOAL #4   Title Pt will verbalize understanding of Parkinson's disease  related resources in local community.    Time 5    Period Weeks    Status New                   Plan - 04/19/21 1155     Clinical Impression Statement Reviewed PWR! Moves quadruped today as well as aerobic activitiy on seated NuStep.  Pt continues to rate intensity with aerobic activity as 7-8/10.  With quadruped position PWR! Step, pt comes quickly from quadruped to 1/2 kneel with significant loss of balance.  Educated patient that he would benefit from use of UE support with practice of PWR! Step position in quadruped (in session next time) and asked pt to hold on PWR! Moves quadruped at home until we practice again in PT session.  Pt will continue to benefit from skilled PT to reinforce optimal movement patterns for improved functional mobility.    Personal Factors and Comorbidities Comorbidity 1;Time since onset of injury/illness/exacerbation   newly diagnosed 03/2021 and started on Sinemet   Comorbidities PMH:  hx of back pain    Examination-Activity Limitations Locomotion Level;Transfers    Examination-Participation Restrictions Community Activity;Yard Work    Stability/Clinical Decision Making Stable/Uncomplicated    Rehab Potential Good    PT Frequency 2x / week   1x/wk for 1 week, then 2x/wk for 4 weeks   PT Duration Other (comment)   5 weeks   PT Treatment/Interventions ADLs/Self Care Home Management;Neuromuscular re-education;Balance training;Therapeutic exercise;Therapeutic activities;Functional mobility training;Gait training;Patient/family education    PT Next Visit Plan Additional visits added; need to check STGs next week.  Perform quadruped PWR! Moves positions again with UE support at chair for support; when pt improve stability with PWR! Step in this position, add back to HEP.  Vestibular balance/corner exercises for compliant surfaces.    PT Home Exercise Plan PWR! moves sitting; standing, all 4's    Consulted and Agree with Plan of Care Patient              Patient will benefit from skilled therapeutic intervention in order to improve the following deficits and impairments:  Abnormal gait, Difficulty walking, Decreased balance, Decreased strength, Postural dysfunction  Visit  Diagnosis: Muscle weakness (generalized)  Unsteadiness on feet  Other symptoms and signs involving the nervous system     Problem List Patient Active Problem List   Diagnosis Date Noted   Parkinson's disease (HCC) 03/22/2021    Jalaine Riggenbach W., PT 04/19/2021, 11:59 AM  [facility] [address] [place], [place], [address] Phone: [phone]   Fax:  [phone]  Name: Jason Herrera MRN: [id number] Date of Birth: 08-Sep-1950

## 2021-04-23 ENCOUNTER — Encounter: Payer: Self-pay | Admitting: Physical Therapy

## 2021-04-23 ENCOUNTER — Ambulatory Visit: Payer: Medicare PPO | Admitting: Occupational Therapy

## 2021-04-23 ENCOUNTER — Encounter: Payer: Self-pay | Admitting: Occupational Therapy

## 2021-04-23 ENCOUNTER — Ambulatory Visit: Payer: Medicare PPO | Admitting: Physical Therapy

## 2021-04-23 ENCOUNTER — Other Ambulatory Visit: Payer: Self-pay

## 2021-04-23 DIAGNOSIS — M6281 Muscle weakness (generalized): Secondary | ICD-10-CM

## 2021-04-23 DIAGNOSIS — R29818 Other symptoms and signs involving the nervous system: Secondary | ICD-10-CM

## 2021-04-23 DIAGNOSIS — R2681 Unsteadiness on feet: Secondary | ICD-10-CM

## 2021-04-23 DIAGNOSIS — R278 Other lack of coordination: Secondary | ICD-10-CM | POA: Diagnosis not present

## 2021-04-23 DIAGNOSIS — R29898 Other symptoms and signs involving the musculoskeletal system: Secondary | ICD-10-CM

## 2021-04-23 DIAGNOSIS — R251 Tremor, unspecified: Secondary | ICD-10-CM

## 2021-04-23 DIAGNOSIS — R2689 Other abnormalities of gait and mobility: Secondary | ICD-10-CM | POA: Diagnosis not present

## 2021-04-23 NOTE — Therapy (Signed)
[facility] [address] [place], [place], [address] Phone: [phone]   Fax:  [phone]  Occupational Therapy Treatment  Patient Details  Name: Jason Herrera MRN: [id number] Date of Birth: Nov 21, 1950 Referring Provider (OT): Tat, Octaviano Batty, DO   Encounter Date: 04/23/2021   OT End of Session - 04/23/21 1027     Visit Number 3    Number of Visits 17    Date for OT Re-Evaluation 06/12/21    Authorization Type [employer]    Authorization Time Period 16 visits    Authorization - Visit Number 2    Authorization - Number of Visits 16    Progress Note Due on Visit 10    OT Start Time 1021    OT Stop Time 1105    OT Time Calculation (min) 44 min    Activity Tolerance Patient tolerated treatment well    Behavior During Therapy [facility] for tasks assessed/performed             Past Medical History:  Diagnosis Date   Back pain     Past Surgical History:  Procedure Laterality Date   HERNIA REPAIR      There were no vitals filed for this visit.   Subjective Assessment - 04/23/21 1022     Subjective  Pt reports doing ball exercises at home.    Patient Stated Goals To learn coping skills and techniques to decrease or distract from tremors                Large amplitude movements with cards and cones.  Noted mild decrease in amplitude with repetition due to fatigue.  Pt with good carryover of head/trunk rotation and big movements.    In hand rotation with ball with focus on fine motor control and coordination. Pt with no noted tremors during [facility] task.  Peg board activity in sitting, mild tremors in L leg while seated.  Minimal tremor in LUE when reaching for pegs but none when placing and manipulating pegs.                    OT Short Term Goals - 04/19/21 1217       OT SHORT TERM GOAL #1   Title Pt will demonstrate improved ease with fastening buttons as evidenced by decreasing 3 button/  unbutton time to 0:30 seconds    Baseline 0:34.53 sec    Time 4    Period Weeks    Status On-going    Target Date 05/15/21      OT SHORT TERM GOAL #2   Title Pt will demonstrate improved UE functional use for ADLs as evidenced by increasing box/ blocks score by 4 blocks with LUE    Baseline L:54 and R: 62    Time 8    Period Weeks    Status On-going      OT SHORT TERM GOAL #3   Title Pt will verbalize understanding of ways to prevent future PD related complications and PD community resources.    Time 8    Period Weeks    Status On-going               OT Long Term Goals - 04/19/21 1217       OT LONG TERM GOAL #1   Title I with PD specific HEP    Time 8    Period Weeks    Status On-going    Target Date  06/12/21      OT LONG TERM GOAL #2   Title Pt will demonstrate ability to retrieve a lightweight object at 140* shoulder flexion and -5* elbow extension with LUE to increase shoulder ROM as needed for ADLs/IADLs.    Baseline 135* and -10*    Time 8    Period Weeks    Status On-going      OT LONG TERM GOAL #3   Title Pt will verbalize understanding of adapted strategies to maximize safety and I with ADLs/ IADLs.    Time 8    Period Weeks    Status On-going                   Plan - 04/23/21 1031     Clinical Impression Statement Pt reponded well to education of big amplitude movements with fine and gross motor tasks this session.  Pt very motivated to work through tremors and keep them at bay.  Throughout session engaging in dual task to challenge carryover of education with focus on large amplitude movements.  Pt demonstrating good carryover of education, waning with fatigue.    OT Occupational Profile and History Detailed Assessment- Review of Records and additional review of physical, cognitive, psychosocial history related to current functional performance    Occupational performance deficits (Please refer to evaluation for details): ADL's;IADL's;Leisure     Body Structure / Function / Physical Skills ADL;Balance;Body mechanics;Coordination;Decreased knowledge of precautions;Decreased knowledge of use of DME;Endurance;Flexibility;FMC;GMC;IADL;Mobility;Pain;ROM;Strength;Tone;UE functional use    Rehab Potential Good    Clinical Decision Making Limited treatment options, no task modification necessary    Comorbidities Affecting Occupational Performance: May have comorbidities impacting occupational performance    Modification or Assistance to Complete Evaluation  No modification of tasks or assist necessary to complete eval    OT Frequency 2x / week    OT Duration 8 weeks    OT Treatment/Interventions Self-care/ADL training;Cryotherapy;Electrical Stimulation;Moist Heat;Ultrasound;Iontophoresis;Therapeutic exercise;Neuromuscular education;Energy conservation;DME and/or AE instruction;Functional Mobility Training;Manual Therapy;Passive range of motion;Therapeutic activities;Cognitive remediation/compensation;Patient/family education;Balance training;Coping strategies training    Plan big amplitude movements, L shoulder ROM, strength, GMC/FMC    OT Home Exercise Plan --    Consulted and Agree with Plan of Care Patient             Patient will benefit from skilled therapeutic intervention in order to improve the following deficits and impairments:   Body Structure / Function / Physical Skills: ADL, Balance, Body mechanics, Coordination, Decreased knowledge of precautions, Decreased knowledge of use of DME, Endurance, Flexibility, FMC, GMC, IADL, Mobility, Pain, ROM, Strength, Tone, UE functional use       Visit Diagnosis: Muscle weakness (generalized)  Unsteadiness on feet  Other symptoms and signs involving the nervous system  Other lack of coordination  Tremor  Other symptoms and signs involving the musculoskeletal system    Problem List Patient Active Problem List   Diagnosis Date Noted   Parkinson's disease (HCC) 03/22/2021     Rosalio Loud, OT 04/23/2021, 3:11 PM  [facility] [address], [address] [place], [place], [address] Phone: [phone]   Fax:  [phone]  Name: Jason Herrera MRN: [id number] Date of Birth: [birth year]

## 2021-04-23 NOTE — Therapy (Signed)
[facility] [address], [address] [place], [place], [address] Phone: [phone]   Fax:  [phone]  Physical Therapy Treatment  Patient Details  Name: Jason Herrera MRN: [id number] Date of Birth: [birth year] Referring Provider (PT): Kerin Salen, DO   Encounter Date: 04/23/2021   PT End of Session - 04/23/21 0937     Visit Number 7    Number of Visits 10    Date for PT Re-Evaluation 05/04/21    Authorization Type [employer]    PT Start Time 0935    PT Stop Time 1015    PT Time Calculation (min) 40 min    Activity Tolerance Patient tolerated treatment well    Behavior During Therapy [facility] for tasks assessed/performed             Past Medical History:  Diagnosis Date   Back pain     Past Surgical History:  Procedure Laterality Date   HERNIA REPAIR      There were no vitals filed for this visit.   Subjective Assessment - 04/23/21 0936     Subjective Shoulder is doing okay.  A little transient pain, but getting better.    Patient Stated Goals Pt's goals for therapy are to minimizing impact of tremors.    Currently in Pain? Yes    Pain Score 1     Pain Location Shoulder    Pain Orientation Left    Pain Descriptors / Indicators Sore    Pain Type Acute pain    Pain Onset In the past 7 days    Pain Frequency Intermittent    Aggravating Factors  certain movements    Pain Relieving Factors improving with time                  Neuro Re-education   Pt performs PWR! Moves in modified quadruped position, UE support at mat table   PWR! Up for improved posture x 20 reps  PWR! Rock for improved weighshifting x 20 reps   PWR! Twist for improved trunk rotation x 5 reps each side   PWR! Step for improved step initiation x 10 reps each side  Cues provided for technique and positioning.   Standing on incline/decline with EO, head turns, head nods x 5 reps; then eyes closed head steady x 10 sec hold.                    [facility] Adult PT Treatment/Exercise - 04/23/21 0001       Self-Care   Self-Care Other Self-Care Comments    Other Self-Care Comments  Discussed balance in relation to compliant surface/eyes closed exercises and situations where he would need to pay extra attention/caution for balance.                 Balance Exercises - 04/23/21 0001       Balance Exercises: Standing   Standing Eyes Opened Narrow base of support (BOS);Wide (BOA);Foam/compliant surface;Limitations    Standing Eyes Opened Limitations Head turns/nods x 5 reps, 2 sets    Standing Eyes Closed Wide (BOA);Narrow base of support (BOS);Foam/compliant surface;2 reps;Time   15 sec   Standing Eyes Closed Time 15 sec    Wall Bumps Hip;Eyes opened;10 reps   on airex   Stepping Strategy Anterior;Posterior;Lateral;Foam/compliant surface;10 reps;Limitations    Stepping Strategy Limitations STanding on Airex, then progressing to forward>back step and weightshift x 10 reps each leg, light UE support.  Initial cues  for foot clearance.    Heel Raises Both;10 reps   on airex   Toe Raise Both;10 reps   on airex                 PT Short Term Goals - 04/02/21 1722       PT SHORT TERM GOAL #1   Title Pt will be independent with HEP for improved strength, balance, transfers, and gait.  TARGET 04/27/2021    Time 3    Period Weeks    Status New               PT Long Term Goals - 04/02/21 1718       PT LONG TERM GOAL #1   Title Pt will verbalize plans for ongoing fitness routine post therapy discharge to maximize gains made in PT.  TARGET 05/04/2021    Time 5    Period Weeks    Status New      PT LONG TERM GOAL #2   Title Pt will improve 5x sit<>stand to less than or equal to 11.5 sec to demonstrate improved functional strength and transfer efficiency.    Baseline 14.87    Time 5    Period Weeks    Status New      PT LONG TERM GOAL #3   Title Pt will stand at least 20 seconds  on foam surface, eyes closed, romberg stance, for improved vestibular system use for balance    Baseline < 10 sec    Time 5    Period Weeks    Status New      PT LONG TERM GOAL #4   Title Pt will verbalize understanding of Parkinson's disease related resources in local community.    Time 5    Period Weeks    Status New                   Plan - 04/23/21 1104     Clinical Impression Statement Performed PWR! Moves quadruped today in modified position, with UE support at mat table.  Pt performs well and appears more stable with PWR! Step position.  Cues for wider BOS upon standing from modified quadruped position, for improved stability upon standing.  Also worked on standing balance activities on compliant surfaces, with light UE support.  Most difficulty is with eyes closed and narrowed BOS with compliant surface.  He will continue to benefit from skilled PT towards goals for improved overall stability, balance, and optimal PD fitness routine.    Personal Factors and Comorbidities Comorbidity 1;Time since onset of injury/illness/exacerbation   newly diagnosed 03/2021 and started on Sinemet   Comorbidities PMH:  hx of back pain    Examination-Activity Limitations Locomotion Level;Transfers    Examination-Participation Restrictions Community Activity;Yard Work    Stability/Clinical Decision Making Stable/Uncomplicated    Rehab Potential Good    PT Frequency 2x / week   1x/wk for 1 week, then 2x/wk for 4 weeks   PT Duration Other (comment)   5 weeks   PT Treatment/Interventions ADLs/Self Care Home Management;Neuromuscular re-education;Balance training;Therapeutic exercise;Therapeutic activities;Functional mobility training;Gait training;Patient/family education    PT Next Visit Plan Check STGs next week.  Perform quadruped PWR! Moves positions again with UE support at chair/mat for support and add back to HEP; when pt improve stability with PWR! Step in this position.  Vestibular  balance/corner exercises for compliant surfaces.  Need to discuss scheduling (as pt sees Erskine Squibb last appt)    PT Home  Exercise Plan PWR! moves sitting; standing, all 4's    Consulted and Agree with Plan of Care Patient             Patient will benefit from skilled therapeutic intervention in order to improve the following deficits and impairments:  Abnormal gait, Difficulty walking, Decreased balance, Decreased strength, Postural dysfunction  Visit Diagnosis: Unsteadiness on feet  Other symptoms and signs involving the nervous system     Problem List Patient Active Problem List   Diagnosis Date Noted   Parkinson's disease (HCC) 03/22/2021    Sabrinna Yearwood W., PT 04/23/2021, 11:11 AM  [facility] [address] [place], [place], [address] Phone: [phone]   Fax:  [phone]  Name: Jason Herrera MRN: [id number] Date of Birth: [birth year]

## 2021-04-26 ENCOUNTER — Encounter: Payer: Self-pay | Admitting: Occupational Therapy

## 2021-04-26 ENCOUNTER — Encounter: Payer: Self-pay | Admitting: Physical Therapy

## 2021-04-26 ENCOUNTER — Ambulatory Visit: Payer: Medicare PPO | Admitting: Occupational Therapy

## 2021-04-26 ENCOUNTER — Ambulatory Visit: Payer: Medicare PPO | Admitting: Physical Therapy

## 2021-04-26 ENCOUNTER — Other Ambulatory Visit: Payer: Self-pay

## 2021-04-26 DIAGNOSIS — M6281 Muscle weakness (generalized): Secondary | ICD-10-CM

## 2021-04-26 DIAGNOSIS — R2681 Unsteadiness on feet: Secondary | ICD-10-CM | POA: Diagnosis not present

## 2021-04-26 DIAGNOSIS — R29818 Other symptoms and signs involving the nervous system: Secondary | ICD-10-CM

## 2021-04-26 DIAGNOSIS — R29898 Other symptoms and signs involving the musculoskeletal system: Secondary | ICD-10-CM | POA: Diagnosis not present

## 2021-04-26 DIAGNOSIS — R2689 Other abnormalities of gait and mobility: Secondary | ICD-10-CM

## 2021-04-26 DIAGNOSIS — R251 Tremor, unspecified: Secondary | ICD-10-CM

## 2021-04-26 DIAGNOSIS — R278 Other lack of coordination: Secondary | ICD-10-CM | POA: Diagnosis not present

## 2021-04-26 NOTE — Therapy (Signed)
[facility] [address] [place], [place], [address] Phone: [phone]   Fax:  [phone]  Occupational Therapy Treatment  Patient Details  Name: Jason Herrera MRN: [id number] Date of Birth: 04/02/1951 Referring Provider (OT): Tat, Octaviano Batty, DO   Encounter Date: 04/26/2021   OT End of Session - 04/26/21 1204     Visit Number 4    Number of Visits 17    Date for OT Re-Evaluation 06/12/21    Authorization Type [employer]    Authorization Time Period 16 visits    Authorization - Visit Number 2    Authorization - Number of Visits 16    Progress Note Due on Visit 10    OT Start Time 1107    OT Stop Time 1150    OT Time Calculation (min) 43 min    Activity Tolerance Patient tolerated treatment well    Behavior During Therapy [facility] for tasks assessed/performed             Past Medical History:  Diagnosis Date   Back pain     Past Surgical History:  Procedure Laterality Date   HERNIA REPAIR      There were no vitals filed for this visit.   Subjective Assessment - 04/26/21 1206     Subjective  Pt reports use of ball exercises at home, but has not started card exercises as he has not been able to locate any.    Patient Stated Goals To learn coping skills and techniques to decrease or distract from tremors    Currently in Pain? No/denies               Engaged in simulated LB dressing and donning jacket.  Pt reports leaning again high bed and crossing legs in figure 4 position when leaning against bed to don pants and socks.  Therapist educating on large amplitude movements during dressing tasks to increase ROM and success with dressing tasks.  Pt donned gown in 9.75 seconds to simulated donning jacket.  Pt with mild difficulty reaching behind self to thread L sleeve.  Discussed difficulty due to mild decreased ROM in LUE.  Educated on continuing to complete in this manner to maintain ROM as long as no pain.   Educated on donning L side first if difficulty increases.   FMC tasks of Peg board activity in standing with focus on big hand movement and hand flicks.  Pt with mild difficulty with coordination with LUE when picking up and placing pegs.  Pt incorporating intermittent hand flicks as needed.  Large amplitude movements with trunk rotation and reaching with big movements with cards followed by resistive clothespins (2#, 4#, 6#) to increase from mid to high range.  Pt with good carryover of open hands and maintaining eye contact with task to faciliate increased trunk rotation.                    OT Short Term Goals - 04/19/21 1217       OT SHORT TERM GOAL #1   Title Pt will demonstrate improved ease with fastening buttons as evidenced by decreasing 3 button/ unbutton time to 0:30 seconds    Baseline 0:34.53 sec    Time 4    Period Weeks    Status On-going    Target Date 05/15/21      OT SHORT TERM GOAL #2   Title Pt will demonstrate improved UE functional use for ADLs as  evidenced by increasing box/ blocks score by 4 blocks with LUE    Baseline L:54 and R: 62    Time 8    Period Weeks    Status On-going      OT SHORT TERM GOAL #3   Title Pt will verbalize understanding of ways to prevent future PD related complications and PD community resources.    Time 8    Period Weeks    Status On-going               OT Long Term Goals - 04/19/21 1217       OT LONG TERM GOAL #1   Title I with PD specific HEP    Time 8    Period Weeks    Status On-going    Target Date 06/12/21      OT LONG TERM GOAL #2   Title Pt will demonstrate ability to retrieve a lightweight object at 140* shoulder flexion and -5* elbow extension with LUE to increase shoulder ROM as needed for ADLs/IADLs.    Baseline 135* and -10*    Time 8    Period Weeks    Status On-going      OT LONG TERM GOAL #3   Title Pt will verbalize understanding of adapted strategies to maximize safety and I with  ADLs/ IADLs.    Time 8    Period Weeks    Status On-going                   Plan - 04/26/21 1205     Clinical Impression Statement Pt continues to demonstrate great carryover of education of big amplitude movements with fine and gross motor tasks this session.  Pt very motivated to work through tremors and keep them at bay.  Pt demonstrating intermittent useage of hand flicks during [facility] tasks, reporting recalling as he felt fatigue and tremors coming on.  Throughout session engaging in dual task to challenge carryover of education with focus on large amplitude movements.    OT Occupational Profile and History Detailed Assessment- Review of Records and additional review of physical, cognitive, psychosocial history related to current functional performance    Occupational performance deficits (Please refer to evaluation for details): ADL's;IADL's;Leisure    Body Structure / Function / Physical Skills ADL;Balance;Body mechanics;Coordination;Decreased knowledge of precautions;Decreased knowledge of use of DME;Endurance;Flexibility;FMC;GMC;IADL;Mobility;Pain;ROM;Strength;Tone;UE functional use    Rehab Potential Good    Clinical Decision Making Limited treatment options, no task modification necessary    Comorbidities Affecting Occupational Performance: May have comorbidities impacting occupational performance    Modification or Assistance to Complete Evaluation  No modification of tasks or assist necessary to complete eval    OT Frequency 2x / week    OT Duration 8 weeks    OT Treatment/Interventions Self-care/ADL training;Cryotherapy;Electrical Stimulation;Moist Heat;Ultrasound;Iontophoresis;Therapeutic exercise;Neuromuscular education;Energy conservation;DME and/or AE instruction;Functional Mobility Training;Manual Therapy;Passive range of motion;Therapeutic activities;Cognitive remediation/compensation;Patient/family education;Balance training;Coping strategies training    Plan big  amplitude movements, L shoulder ROM, strength, GMC/FMC.  print and provide with functional activities for PD    Consulted and Agree with Plan of Care Patient             Patient will benefit from skilled therapeutic intervention in order to improve the following deficits and impairments:   Body Structure / Function / Physical Skills: ADL, Balance, Body mechanics, Coordination, Decreased knowledge of precautions, Decreased knowledge of use of DME, Endurance, Flexibility, FMC, GMC, IADL, Mobility, Pain, ROM, Strength, Tone, UE functional use  Visit Diagnosis: Muscle weakness (generalized)  Unsteadiness on feet  Other symptoms and signs involving the nervous system  Other lack of coordination  Tremor    Problem List Patient Active Problem List   Diagnosis Date Noted   Parkinson's disease (HCC) 03/22/2021    Rosalio Loud, OT 04/26/2021, 12:54 PM  [facility] [address] [place], [place], [address] Phone: [phone]   Fax:  [phone]  Name: DATON SZILAGYI MRN: [id number] Date of Birth: [birth year]

## 2021-04-26 NOTE — Therapy (Signed)
[facility] [address] [place], [place], [address] Phone: [phone]   Fax:  [phone]  Physical Therapy Treatment  Patient Details  Name: Jason Herrera MRN: [id number] Date of Birth: Mar 18, 1951 Referring Provider (PT): Kerin Salen, DO   Encounter Date: 04/26/2021   PT End of Session - 04/26/21 1237     Visit Number 8    Number of Visits 10    Date for PT Re-Evaluation 05/04/21    Authorization Type [employer]    PT Start Time 1150    PT Stop Time 1234    PT Time Calculation (min) 44 min    Activity Tolerance Patient tolerated treatment well    Behavior During Therapy [facility] for tasks assessed/performed             Past Medical History:  Diagnosis Date   Back pain     Past Surgical History:  Procedure Laterality Date   HERNIA REPAIR      There were no vitals filed for this visit.   Subjective Assessment - 04/26/21 1151     Subjective Denies any falls or changes since last session.  Has been doing seated and standing PWR!  Has not tried modified quadruped.    Patient Stated Goals Pt's goals for therapy are to minimizing impact of tremors.    Currently in Pain? No/denies    Pain Onset In the past 7 days               PWR [facility]) - 04/26/21 1215     PWR! exercises Moves in standing;Moves in [place]    [place]! Up 20    PWR! Rock 20    PWR! Twist 20    PWR! Step 20    Comments Performed in modified quadruped kneeling on mat infront of mat table    PWR! Up 20    PWR! Rock 20    PWR! Twist 20    PWR Step 20    Basic 4 Flow 5 reps    Comments Instructed in Basic flow in standing.  Instructed pt to have sheet mounted on wall infront of him initially when practicing at home.                  PT Education - 04/26/21 1233     Education Details If performes PWR! moves in quadruped, needs to do modified quadruped with chair in front of pt for safety, PWR! moves standing flow, Optimal  community fitness (pt to check into Silver sneakers benefit with his insurance and joining facility)    Person(s) Educated Patient    Methods Explanation;Demonstration;Verbal cues    Comprehension Verbalized understanding              PT Short Term Goals - 04/02/21 1722       PT SHORT TERM GOAL #1   Title Pt will be independent with HEP for improved strength, balance, transfers, and gait.  TARGET 04/27/2021    Time 3    Period Weeks    Status New               PT Long Term Goals - 04/02/21 1718       PT LONG TERM GOAL #1   Title Pt will verbalize plans for ongoing fitness routine post therapy discharge to maximize gains made in PT.  TARGET 05/04/2021    Time 5    Period Weeks    Status New  PT LONG TERM GOAL #2   Title Pt will improve 5x sit<>stand to less than or equal to 11.5 sec to demonstrate improved functional strength and transfer efficiency.    Baseline 14.87    Time 5    Period Weeks    Status New      PT LONG TERM GOAL #3   Title Pt will stand at least 20 seconds on foam surface, eyes closed, romberg stance, for improved vestibular system use for balance    Baseline < 10 sec    Time 5    Period Weeks    Status New      PT LONG TERM GOAL #4   Title Pt will verbalize understanding of Parkinson's disease related resources in local community.    Time 5    Period Weeks    Status New                   Plan - 04/26/21 1324     Clinical Impression Statement Initiated PWR! flow today for standing moves.  Pt states he still needs visual cue of handout for exercises.  Cont per poc with PT's plan to begin checking goals at next session.    Personal Factors and Comorbidities Comorbidity 1;Time since onset of injury/illness/exacerbation   newly diagnosed 03/2021 and started on Sinemet   Comorbidities PMH:  hx of back pain    Examination-Activity Limitations Locomotion Level;Transfers    Examination-Participation Restrictions Community  Activity;Yard Work    Stability/Clinical Decision Making Stable/Uncomplicated    Rehab Potential Good    PT Frequency 2x / week   1x/wk for 1 week, then 2x/wk for 4 weeks   PT Duration Other (comment)   5 weeks   PT Treatment/Interventions ADLs/Self Care Home Management;Neuromuscular re-education;Balance training;Therapeutic exercise;Therapeutic activities;Functional mobility training;Gait training;Patient/family education    PT Next Visit Plan Check goals next week. Need to discuss scheduling (as pt sees Erskine Squibb last appt)    PT Home Exercise Plan PWR! moves sitting; standing, all 4's    Consulted and Agree with Plan of Care Patient             Patient will benefit from skilled therapeutic intervention in order to improve the following deficits and impairments:  Abnormal gait, Difficulty walking, Decreased balance, Decreased strength, Postural dysfunction  Visit Diagnosis: Muscle weakness (generalized)  Other abnormalities of gait and mobility  Unsteadiness on feet  Other symptoms and signs involving the nervous system     Problem List Patient Active Problem List   Diagnosis Date Noted   Parkinson's disease (HCC) 03/22/2021   Newell Coral, PTA [facility] 04/26/21 1:29 PM Phone: [phone] Fax: [phone]   [place] [facility] [address], [address] [place], [place], [address] Phone: [phone]   Fax:  [phone]  Name: Jason Herrera MRN: [id number] Date of Birth: 03-Apr-1951

## 2021-05-01 ENCOUNTER — Encounter: Payer: Self-pay | Admitting: Occupational Therapy

## 2021-05-01 ENCOUNTER — Ambulatory Visit: Payer: Medicare PPO | Admitting: Occupational Therapy

## 2021-05-01 ENCOUNTER — Other Ambulatory Visit: Payer: Self-pay

## 2021-05-01 ENCOUNTER — Ambulatory Visit: Payer: Medicare PPO | Admitting: Physical Therapy

## 2021-05-01 ENCOUNTER — Encounter: Payer: Self-pay | Admitting: Physical Therapy

## 2021-05-01 DIAGNOSIS — R2689 Other abnormalities of gait and mobility: Secondary | ICD-10-CM

## 2021-05-01 DIAGNOSIS — R29818 Other symptoms and signs involving the nervous system: Secondary | ICD-10-CM

## 2021-05-01 DIAGNOSIS — M6281 Muscle weakness (generalized): Secondary | ICD-10-CM | POA: Diagnosis not present

## 2021-05-01 DIAGNOSIS — R2681 Unsteadiness on feet: Secondary | ICD-10-CM | POA: Diagnosis not present

## 2021-05-01 DIAGNOSIS — R29898 Other symptoms and signs involving the musculoskeletal system: Secondary | ICD-10-CM

## 2021-05-01 DIAGNOSIS — R251 Tremor, unspecified: Secondary | ICD-10-CM

## 2021-05-01 DIAGNOSIS — R278 Other lack of coordination: Secondary | ICD-10-CM

## 2021-05-01 NOTE — Therapy (Signed)
[facility] [address] [place], [place], [address] Phone: [phone]   Fax:  [phone]  Occupational Therapy Treatment  Patient Details  Name: Jason Herrera MRN: [id number] Date of Birth: Jul 22, 1950 Referring Provider (OT): Tat, Octaviano Batty, DO   Encounter Date: 05/01/2021   OT End of Session - 05/01/21 0933     Visit Number 5    Number of Visits 17    Date for OT Re-Evaluation 06/12/21    Authorization Type [employer]    Authorization Time Period 16 visits    Authorization - Visit Number 2    Authorization - Number of Visits 16    Progress Note Due on Visit 10    OT Start Time 0932    OT Stop Time 1018    OT Time Calculation (min) 46 min    Activity Tolerance Patient tolerated treatment well    Behavior During Therapy [facility] for tasks assessed/performed             Past Medical History:  Diagnosis Date   Back pain     Past Surgical History:  Procedure Laterality Date   HERNIA REPAIR      There were no vitals filed for this visit.   Subjective Assessment - 05/01/21 0932     Subjective  Pt reports enjoying time with family over the holidays.    Patient Stated Goals To learn coping skills and techniques to decrease or distract from tremors    Currently in Pain? No/denies                           OT Treatments/Exercises (OP) - 05/01/21 1205       ADLs   ADL Comments Educated on use of large amplitude movements with self-care tasks of LB dressing, donning jacket. Provided handout for functional activities with ADLs and IADLs with focus on large amplitude movements while completing specific tasks.  See Instructions for further specifics.      Exercises   Exercises Neck;Shoulder      Neck, Chest, and Shoulder: Stretches   Other Stretches Seated:Neck and chest stretch, Shoulder blade squeeze, rotation stretch - simulating donning seatbelt.  Pt reports stretch at end range. Overhead  stretch, seated side stretch, Supine: Shoulder stretch and rotation stretch. Noted increased stretch in LUE at pec in supine rotation stretch. Therapist providing instruction to hold stretch at end range.  Educated on functional carryover of each stretch.                      OT Short Term Goals - 04/19/21 1217       OT SHORT TERM GOAL #1   Title Pt will demonstrate improved ease with fastening buttons as evidenced by decreasing 3 button/ unbutton time to 0:30 seconds    Baseline 0:34.53 sec    Time 4    Period Weeks    Status On-going    Target Date 05/15/21      OT SHORT TERM GOAL #2   Title Pt will demonstrate improved UE functional use for ADLs as evidenced by increasing box/ blocks score by 4 blocks with LUE    Baseline L:54 and R: 62    Time 8    Period Weeks    Status On-going      OT SHORT TERM GOAL #3   Title Pt will verbalize understanding of ways to prevent future PD related  complications and PD community resources.    Time 8    Period Weeks    Status On-going               OT Long Term Goals - 04/19/21 1217       OT LONG TERM GOAL #1   Title I with PD specific HEP    Time 8    Period Weeks    Status On-going    Target Date 06/12/21      OT LONG TERM GOAL #2   Title Pt will demonstrate ability to retrieve a lightweight object at 140* shoulder flexion and -5* elbow extension with LUE to increase shoulder ROM as needed for ADLs/IADLs.    Baseline 135* and -10*    Time 8    Period Weeks    Status On-going      OT LONG TERM GOAL #3   Title Pt will verbalize understanding of adapted strategies to maximize safety and I with ADLs/ IADLs.    Time 8    Period Weeks    Status On-going                   Plan - 05/01/21 0933     Clinical Impression Statement Pt continues to demonstrate great carryover of education of big amplitude movements with fine and gross motor tasks this session.  Pt very motivated to work through tremors and  keep them at bay.  Engaged in neck, chest, and shoulder stretching and reports understanding for functional carryover of stretches.  Pt requiring min cues to hold stretch at end range for further stretch.    OT Occupational Profile and History Detailed Assessment- Review of Records and additional review of physical, cognitive, psychosocial history related to current functional performance    Occupational performance deficits (Please refer to evaluation for details): ADL's;IADL's;Leisure    Body Structure / Function / Physical Skills ADL;Balance;Body mechanics;Coordination;Decreased knowledge of precautions;Decreased knowledge of use of DME;Endurance;Flexibility;FMC;GMC;IADL;Mobility;Pain;ROM;Strength;Tone;UE functional use    Rehab Potential Good    Clinical Decision Making Limited treatment options, no task modification necessary    Comorbidities Affecting Occupational Performance: May have comorbidities impacting occupational performance    Modification or Assistance to Complete Evaluation  No modification of tasks or assist necessary to complete eval    OT Frequency 2x / week    OT Duration 8 weeks    OT Treatment/Interventions Self-care/ADL training;Cryotherapy;Electrical Stimulation;Moist Heat;Ultrasound;Iontophoresis;Therapeutic exercise;Neuromuscular education;Energy conservation;DME and/or AE instruction;Functional Mobility Training;Manual Therapy;Passive range of motion;Therapeutic activities;Cognitive remediation/compensation;Patient/family education;Balance training;Coping strategies training    Plan big amplitude movements, L shoulder ROM, strength, GMC/FMC.    OT Home Exercise Plan Chest, neck, and shoulder stretches administered    Consulted and Agree with Plan of Care Patient             Patient will benefit from skilled therapeutic intervention in order to improve the following deficits and impairments:   Body Structure / Function / Physical Skills: ADL, Balance, Body mechanics,  Coordination, Decreased knowledge of precautions, Decreased knowledge of use of DME, Endurance, Flexibility, FMC, GMC, IADL, Mobility, Pain, ROM, Strength, Tone, UE functional use       Visit Diagnosis: Other abnormalities of gait and mobility  Muscle weakness (generalized)  Other symptoms and signs involving the nervous system  Other lack of coordination  Tremor  Other symptoms and signs involving the musculoskeletal system    Problem List Patient Active Problem List   Diagnosis Date Noted   Parkinson's disease ([facility]) 03/22/2021  Rosalio Loud, OT 05/01/2021, 12:16 PM  [facility] [address], [address] [place], [place], [address] Phone: [phone]   Fax:  [phone]  Name: Jason Herrera MRN: [id number] Date of Birth: 01/02/1951

## 2021-05-01 NOTE — Patient Instructions (Signed)

## 2021-05-01 NOTE — Therapy (Signed)
[facility] [address] [place], [place], [address] Phone: [phone]   Fax:  [phone]  Physical Therapy Treatment/Discharge Summary  Patient Details  Name: Jason Herrera MRN: [id number] Date of Birth: 1951-01-11 Referring Provider (PT): Wells Guiles Tat, DO PHYSICAL THERAPY DISCHARGE SUMMARY  Visits from Start of Care: 9  Current functional level related to goals / functional outcomes:  PT Long Term Goals - 05/01/21 1036       PT LONG TERM GOAL #1   Title Pt will verbalize plans for ongoing fitness routine post therapy discharge to maximize gains made in PT.  TARGET 05/04/2021    Baseline Verbalizes plan for walking, looking into [employer] classes and Silver Sneakers.    Time 5    Period Weeks    Status Achieved      PT LONG TERM GOAL #2   Title Pt will improve 5x sit<>stand to less than or equal to 11.5 sec to demonstrate improved functional strength and transfer efficiency.    Baseline 14.87; 13.15 sec 05/01/21    Time 5    Period Weeks    Status Not Met      PT LONG TERM GOAL #3   Title Pt will stand at least 20 seconds on foam surface, eyes closed, romberg stance, for improved vestibular system use for balance    Baseline < 10 sec; 30 sec 05/01/2021    Time 5    Period Weeks    Status Achieved      PT LONG TERM GOAL #4   Title Pt will verbalize understanding of Parkinson's disease related resources in local community.    Time 5    Period Weeks    Status Achieved            Pt has met 3 of 4 LTGs.   Remaining deficits: LUE tremor, high level balance (improving)   Education / Equipment: Educated in [employer], Parkinson's local and exercise resources.   Patient agrees to discharge. Patient goals were met. Patient is being discharged due to meeting the stated rehab goals.  Mady Haagensen, PT 05/01/21 5:38 PM Phone: [phone] Fax: [phone]    Encounter Date: 05/01/2021   PT End of Session -  05/01/21 1027     Visit Number 9    Number of Visits 10    Date for PT Re-Evaluation 05/04/21    Authorization Type [employer]    PT Start Time 1020    PT Stop Time 1059    PT Time Calculation (min) 39 min    Activity Tolerance Patient tolerated treatment well    Behavior During Therapy WFL for tasks assessed/performed             Past Medical History:  Diagnosis Date   Back pain     Past Surgical History:  Procedure Laterality Date   HERNIA REPAIR      There were no vitals filed for this visit.   Subjective Assessment - 05/01/21 1024     Subjective Got a box ball set and have been practicing that.  Been on the full dose of the medication for 2 weeks and notice less tremors at rest.    Patient Stated Goals Pt's goals for therapy are to minimizing impact of tremors.    Currently in Pain? No/denies    Pain Onset In the past 7 days  [facility] PT Treatment/Exercise - 05/01/21 0001       Transfers   Transfers Sit to Stand;Stand to Sit    Sit to Stand 6: Modified independent (Device/Increase time);Without upper extremity assist;From chair/3-in-1    Five time sit to stand comments  13.15    Stand to Sit 6: Modified independent (Device/Increase time);Without upper extremity assist;To chair/3-in-1      Ambulation/Gait   Ambulation/Gait Yes    Gait velocity 8.62 sec = 3.81 ft/sec      Self-Care   Self-Care Other Self-Care Comments    Other Self-Care Comments  Discussed progress towards goals, POC, including plans for d/c this visit.  Discussed [employer] and cycling classes as PD-specific community fitness options; discussed optimal fitness routine after d/c from PT.  Discussed return PT eval versus screens and pt would benefit from return screen in 6 months.      Neuro Re-ed    Neuro Re-ed Details  Standing EO and EC solid surface x 30 seconds; then EO foam x 30 seconds, then EC 30 seconds, then feet together EC  on foam x 30 seconds             Reviewed [employer]oves Flow in standing, x 5 reps Provided visual cues with handouts on computer.  Pt needs minimal cues for technique and reports he uses his handouts for exercise.        PT Education - 05/01/21 1726     Education Details progress towards goals, POC, and plans for return screens in 6 months    Person(s) Educated Patient    Methods Explanation    Comprehension Verbalized understanding              PT Short Term Goals - 04/02/21 1722       PT SHORT TERM GOAL #1   Title Pt will be independent with HEP for improved strength, balance, transfers, and gait.  TARGET 04/27/2021    Time 3    Period Weeks    Status New               PT Long Term Goals - 05/01/21 1036       PT LONG TERM GOAL #1   Title Pt will verbalize plans for ongoing fitness routine post therapy discharge to maximize gains made in PT.  TARGET 05/04/2021    Baseline Verbalizes plan for walking, looking into [employer] classes and Silver Sneakers.    Time 5    Period Weeks    Status Achieved      PT LONG TERM GOAL #2   Title Pt will improve 5x sit<>stand to less than or equal to 11.5 sec to demonstrate improved functional strength and transfer efficiency.    Baseline 14.87; 13.15 sec 05/01/21    Time 5    Period Weeks    Status Not Met      PT LONG TERM GOAL #3   Title Pt will stand at least 20 seconds on foam surface, eyes closed, romberg stance, for improved vestibular system use for balance    Baseline < 10 sec; 30 sec 05/01/2021    Time 5    Period Weeks    Status Achieved      PT LONG TERM GOAL #4   Title Pt will verbalize understanding of Parkinson's disease related resources in local community.    Time 5    Period Weeks    Status Achieved  Plan - 05/01/21 1727     Clinical Impression Statement Assessed LTGs this visit, with pt meeting 3 of 5 LTGs.  He has improved on functional measures, including 5x  sit<>stand, TUG and gait velocity.  He has demonstrated understanding of HEP progression and community PD resources.  He is appropriate for d/c from PT at this time.  Recommend return PD screens in 6 months.    Personal Factors and Comorbidities Comorbidity 1;Time since onset of injury/illness/exacerbation   newly diagnosed 03/2021 and started on Sinemet   Comorbidities PMH:  hx of back pain    Examination-Activity Limitations Locomotion Level;Transfers    Examination-Participation Restrictions Community Activity;Yard Work    Stability/Clinical Decision Making Stable/Uncomplicated    Rehab Potential Good    PT Frequency 2x / week   1x/wk for 1 week, then 2x/wk for 4 weeks   PT Duration Other (comment)   5 weeks   PT Treatment/Interventions ADLs/Self Care Home Management;Neuromuscular re-education;Balance training;Therapeutic exercise;Therapeutic activities;Functional mobility training;Gait training;Patient/family education    PT Next Visit Plan Discharge PT this visit and plan for return screens in 6 months.  They need to be scheduled when d/c from OT.    PT Home Exercise Plan P[employer]! moves sitting; standing, all 4's    Consulted and Agree with Plan of Care Patient             Patient will benefit from skilled therapeutic intervention in order to improve the following deficits and impairments:  Abnormal gait, Difficulty walking, Decreased balance, Decreased strength, Postural dysfunction  Visit Diagnosis: Other abnormalities of gait and mobility  Unsteadiness on feet  Other symptoms and signs involving the nervous system     Problem List Patient Active Problem List   Diagnosis Date Noted   Parkinson's disease ([place]) 03/22/2021    Kylan Veach W., PT 05/01/2021, 5:37 PM  [facility] [address], [place], [place], [address] Phone: [phone]   Fax:  [phone]  Name: PRATIK DALZIEL MRN: [id number] Date of Birth:  08-25-1950

## 2021-05-03 ENCOUNTER — Other Ambulatory Visit: Payer: Self-pay

## 2021-05-03 ENCOUNTER — Ambulatory Visit: Payer: Medicare PPO | Admitting: Occupational Therapy

## 2021-05-03 ENCOUNTER — Ambulatory Visit: Payer: Medicare PPO | Admitting: Physical Therapy

## 2021-05-03 ENCOUNTER — Encounter: Payer: Self-pay | Admitting: Occupational Therapy

## 2021-05-03 DIAGNOSIS — R251 Tremor, unspecified: Secondary | ICD-10-CM

## 2021-05-03 DIAGNOSIS — R29818 Other symptoms and signs involving the nervous system: Secondary | ICD-10-CM | POA: Diagnosis not present

## 2021-05-03 DIAGNOSIS — R278 Other lack of coordination: Secondary | ICD-10-CM

## 2021-05-03 DIAGNOSIS — R2681 Unsteadiness on feet: Secondary | ICD-10-CM | POA: Diagnosis not present

## 2021-05-03 DIAGNOSIS — M6281 Muscle weakness (generalized): Secondary | ICD-10-CM | POA: Diagnosis not present

## 2021-05-03 DIAGNOSIS — R29898 Other symptoms and signs involving the musculoskeletal system: Secondary | ICD-10-CM | POA: Diagnosis not present

## 2021-05-03 DIAGNOSIS — R2689 Other abnormalities of gait and mobility: Secondary | ICD-10-CM | POA: Diagnosis not present

## 2021-05-03 NOTE — Therapy (Signed)
[facility] [address], [place], [place], [address] Phone: [phone]   Fax:  [phone]  Occupational Therapy Treatment  Patient Details  Name: Jason Herrera MRN: [id number] Date of Birth: 04-Oct-1950 Referring Provider (OT): Tat, Eustace Quail, DO   Encounter Date: 05/03/2021   OT End of Session - 05/03/21 1016     Visit Number 6    Number of Visits 17    Date for OT Re-Evaluation 06/12/21    Authorization Type [employer]    Authorization Time Period 16 visits    Authorization - Visit Number 6    Authorization - Number of Visits 16    Progress Note Due on Visit 10    OT Start Time 1015    OT Stop Time 1100    OT Time Calculation (min) 45 min    Activity Tolerance Patient tolerated treatment well    Behavior During Therapy [facility] for tasks assessed/performed             Past Medical History:  Diagnosis Date   Back pain     Past Surgical History:  Procedure Laterality Date   HERNIA REPAIR      There were no vitals filed for this visit.   Subjective Assessment - 05/03/21 1015     Subjective  Pt reports noticing decreased resting tremor since having been on full dose ~3 weeks.    Patient Stated Goals To learn coping skills and techniques to decrease or distract from tremors    Currently in Pain? No/denies                [facility] OT Assessment - 05/03/21 1114                                Observation/Other Assessments   Donning Doffing Jacket Time (seconds) buttons 28.81 (34.53 on eval)      Coordination   Box and Blocks L: 59 (R: 62 and L:54 on eval)                     OT Treatments/Exercises (OP) - 05/03/21 1111       Bed Mobility   Bed Mobility --   Pt able to get in/out of supine and prone and in/out of quadruped on mat table at Mod I level.     ADLs   ADL Comments Educated on use of large amplitude movements and hand flicks with self-care tasks of buttoning/unbuttoning  button.  Pt demonstrating improvement of time to 28.81 seconds.  Educated on large amplitude movements during home renovation tasks.      Exercises   Exercises --   Strengthening exercises: bridge, quadruped with alternating reach, back extension with focus on sustained hold of each position 3-5 seconds.  Pt educated on stretching and strengthening and core stability with each exercise.                     OT Short Term Goals - 05/03/21 1051       OT SHORT TERM GOAL #1   Title Pt will demonstrate improved ease with fastening buttons as evidenced by decreasing 3 button/ unbutton time to 0:30 seconds    Baseline 0:34.53 sec   28.81 seconds   Time 4    Period Weeks    Status Achieved    Target Date 05/15/21      OT  SHORT TERM GOAL #2   Title Pt will demonstrate improved UE functional use for ADLs as evidenced by increasing box/ blocks score by 4 blocks with LUE    Baseline L:54 and R: 62   L: 59   Time 4    Period Weeks    Status Achieved      OT SHORT TERM GOAL #3   Title Pt will verbalize understanding of ways to prevent future PD related complications and PD community resources.    Time 4    Period Weeks    Status On-going               OT Long Term Goals - 04/19/21 1217       OT LONG TERM GOAL #1   Title I with PD specific HEP    Time 8    Period Weeks    Status On-going    Target Date 06/12/21      OT LONG TERM GOAL #2   Title Pt will demonstrate ability to retrieve a lightweight object at 140* shoulder flexion and -5* elbow extension with LUE to increase shoulder ROM as needed for ADLs/IADLs.    Baseline 135* and -10*    Time 8    Period Weeks    Status On-going      OT LONG TERM GOAL #3   Title Pt will verbalize understanding of adapted strategies to maximize safety and I with ADLs/ IADLs.    Time 8    Period Weeks    Status On-going                   Plan - 05/03/21 1016     Clinical Impression Statement Pt continues to  demonstrate great carryover of education of big amplitude movements with fine and gross motor tasks this session.  Pt has met 2 of 3 short term goals secondary to improved fine and gross motor coordionation, carrying over into improved times with 3 button task and Box and Blocks assessment.  Pt very motivated to work through tremors and keep them at [place].    OT Occupational Profile and History Detailed Assessment- Review of Records and additional review of physical, cognitive, psychosocial history related to current functional performance    Occupational performance deficits (Please refer to evaluation for details): ADL's;IADL's;Leisure    Body Structure / Function / Physical Skills ADL;Balance;Body mechanics;Coordination;Decreased knowledge of precautions;Decreased knowledge of use of DME;Endurance;Flexibility;FMC;GMC;IADL;Mobility;Pain;ROM;Strength;Tone;UE functional use    Rehab Potential Good    Clinical Decision Making Limited treatment options, no task modification necessary    Comorbidities Affecting Occupational Performance: May have comorbidities impacting occupational performance    Modification or Assistance to Complete Evaluation  No modification of tasks or assist necessary to complete eval    OT Frequency 2x / week    OT Duration 8 weeks    OT Treatment/Interventions Self-care/ADL training;Cryotherapy;Electrical Stimulation;Moist Heat;Ultrasound;Iontophoresis;Therapeutic exercise;Neuromuscular education;Energy conservation;DME and/or AE instruction;Functional Mobility Training;Manual Therapy;Passive range of motion;Therapeutic activities;Cognitive remediation/compensation;Patient/family education;Balance training;Coping strategies training    Plan big amplitude movements, L shoulder ROM as needed for house remodeling, GMC/FMC    OT Home Exercise Plan floor exercises for stretching    Consulted and Agree with Plan of Care Patient             Patient will benefit from skilled  therapeutic intervention in order to improve the following deficits and impairments:   Body Structure / Function / Physical Skills: ADL, Balance, Body mechanics, Coordination, Decreased knowledge of precautions, Decreased  knowledge of use of DME, Endurance, Flexibility, FMC, GMC, IADL, Mobility, Pain, ROM, Strength, Tone, UE functional use       Visit Diagnosis: Unsteadiness on feet  Other symptoms and signs involving the nervous system  Muscle weakness (generalized)  Other lack of coordination  Tremor    Problem List Patient Active Problem List   Diagnosis Date Noted   Parkinson's disease ([place]) 03/22/2021    Simonne Come, OT 05/03/2021, 11:24 AM  [facility] [address] [place], [place], [address] Phone: [phone]   Fax:  [phone]  Name: Jason Herrera MRN: [id number] Date of Birth: [birth year]

## 2021-05-08 ENCOUNTER — Ambulatory Visit: Payer: Medicare PPO | Attending: Neurology | Admitting: Occupational Therapy

## 2021-05-08 ENCOUNTER — Encounter: Payer: Self-pay | Admitting: Occupational Therapy

## 2021-05-08 ENCOUNTER — Other Ambulatory Visit: Payer: Self-pay

## 2021-05-08 DIAGNOSIS — R29818 Other symptoms and signs involving the nervous system: Secondary | ICD-10-CM | POA: Insufficient documentation

## 2021-05-08 DIAGNOSIS — R251 Tremor, unspecified: Secondary | ICD-10-CM | POA: Diagnosis not present

## 2021-05-08 DIAGNOSIS — R2681 Unsteadiness on feet: Secondary | ICD-10-CM | POA: Diagnosis not present

## 2021-05-08 DIAGNOSIS — M6281 Muscle weakness (generalized): Secondary | ICD-10-CM | POA: Insufficient documentation

## 2021-05-08 DIAGNOSIS — R278 Other lack of coordination: Secondary | ICD-10-CM | POA: Insufficient documentation

## 2021-05-08 NOTE — Therapy (Signed)
[facility] [address], [address] [place], [place], [address] Phone: [phone]   Fax:  [phone]  Occupational Therapy Treatment  Patient Details  Name: Jason Herrera MRN: [id number] Date of Birth: 1950-10-23 Referring Provider (OT): Tat, Octaviano Battyebecca S, DO   Encounter Date: 05/08/2021   OT End of Session - 05/08/21 1110     Visit Number 7    Number of Visits 17    Date for OT Re-Evaluation 06/12/21    Authorization Type [employer]    Authorization Time Period 16 visits    Authorization - Visit Number 7    Authorization - Number of Visits 16    Progress Note Due on Visit 10    OT Start Time 1110    OT Stop Time 1148    OT Time Calculation (min) 38 min    Activity Tolerance Patient tolerated treatment well    Behavior During Therapy [facility] for tasks assessed/performed             Past Medical History:  Diagnosis Date   Back pain     Past Surgical History:  Procedure Laterality Date   HERNIA REPAIR      There were no vitals filed for this visit.   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    Patient Stated Goals To learn coping skills and techniques to decrease or distract from tremors    Currently in Pain? Yes    Pain Score 5     Pain Location Back    Pain Orientation Mid;Lower    Pain Descriptors / Indicators Discomfort;Sharp;Shooting    Pain Type Acute pain    Pain Onset In the past 7 days    Pain Frequency Constant    Aggravating Factors  certain movements               GMC with reaching, incorporating stepping to increase trunk rotation for full movement.  Focus on big amplitude movements with opening hand to grasp cone and when releasing cone. Pt demonstrating good trunk rotation and head turn with reaching, reports no increase in pain in back with movement.   Completed on R and L for symmetry. Noted mild tremor in LUE when reaching with R.  GMC/FMC with pouring water from cup to cup with L hand.  Pt with no tremors at rest or during activity.  Pt reports increased confidence with carrying full cup of coffee in L hand.  Cup stacking with L hand with focus on big amplitude movements with pt demonstrating good open form when releasing cups.    GMC with ball toss in sitting, standing, and with ambulation.  Pt able to complete with good arc when tossing R <> L hand even during ambulation.                  OT Education - 05/08/21 1202     Education Details educated on large amplitude movements with ADLs and IADLs    Person(s) Educated Patient    Methods Explanation    Comprehension Verbalized understanding              OT Short Term Goals - 05/03/21 1051       OT SHORT TERM GOAL #1   Title Pt will demonstrate improved ease with fastening buttons as  evidenced by decreasing 3 button/ unbutton time to 0:30 seconds    Baseline 0:34.53 sec   28.81 seconds   Time 4    Period Weeks    Status Achieved    Target Date 05/15/21      OT SHORT TERM GOAL #2   Title Pt will demonstrate improved UE functional use for ADLs as evidenced by increasing box/ blocks score by 4 blocks with LUE    Baseline L:54 and R: 62   L: 59   Time 4    Period Weeks    Status Achieved      OT SHORT TERM GOAL #3   Title Pt will verbalize understanding of ways to prevent future PD related complications and PD community resources.    Time 4    Period Weeks    Status On-going               OT Long Term Goals - 04/19/21 1217       OT LONG TERM GOAL #1   Title I with PD specific HEP    Time 8    Period Weeks    Status On-going    Target Date 06/12/21      OT LONG TERM GOAL #2   Title Pt will demonstrate ability to retrieve a lightweight object at 140* shoulder flexion and -5* elbow extension with LUE to increase shoulder ROM as needed for  ADLs/IADLs.    Baseline 135* and -10*    Time 8    Period Weeks    Status On-going      OT LONG TERM GOAL #3   Title Pt will verbalize understanding of adapted strategies to maximize safety and I with ADLs/ IADLs.    Time 8    Period Weeks    Status On-going                   Plan - 05/08/21 1110     Clinical Impression Statement Pt continues to demonstrate great carryover of education of big amplitude movements with fine and gross motor tasks this session.  Pt limited with large, gross motor movements this session due to back injury last week limiting bending and trunk rotation.  Pt very motivated to work through tremors and keep them at bay.    OT Occupational Profile and History Detailed Assessment- Review of Records and additional review of physical, cognitive, psychosocial history related to current functional performance    Occupational performance deficits (Please refer to evaluation for details): ADL's;IADL's;Leisure    Body Structure / Function / Physical Skills ADL;Balance;Body mechanics;Coordination;Decreased knowledge of precautions;Decreased knowledge of use of DME;Endurance;Flexibility;FMC;GMC;IADL;Mobility;Pain;ROM;Strength;Tone;UE functional use    Rehab Potential Good    Clinical Decision Making Limited treatment options, no task modification necessary    Comorbidities Affecting Occupational Performance: May have comorbidities impacting occupational performance    Modification or Assistance to Complete Evaluation  No modification of tasks or assist necessary to complete eval    OT Frequency 2x / week    OT Duration 8 weeks    OT Treatment/Interventions Self-care/ADL training;Cryotherapy;Electrical Stimulation;Moist Heat;Ultrasound;Iontophoresis;Therapeutic exercise;Neuromuscular education;Energy conservation;DME and/or AE instruction;Functional Mobility Training;Manual Therapy;Passive range of motion;Therapeutic activities;Cognitive  remediation/compensation;Patient/family education;Balance training;Coping strategies training    Plan big amplitude movements, L shoulder ROM with bending and reaching as needed for IADLs, GMC/FMC    OT Home Exercise Plan --    Consulted and Agree with Plan of Care Patient  Patient will benefit from skilled therapeutic intervention in order to improve the following deficits and impairments:   Body Structure / Function / Physical Skills: ADL, Balance, Body mechanics, Coordination, Decreased knowledge of precautions, Decreased knowledge of use of DME, Endurance, Flexibility, FMC, GMC, IADL, Mobility, Pain, ROM, Strength, Tone, UE functional use       Visit Diagnosis: Unsteadiness on feet  Other symptoms and signs involving the nervous system  Muscle weakness (generalized)  Other lack of coordination  Tremor    Problem List Patient Active Problem List   Diagnosis Date Noted   Parkinson's disease (HCC) 03/22/2021    Rosalio Loud, OT 05/08/2021, 12:02 PM  [facility] [address] [place], [place], [address] Phone: [phone]   Fax:  [phone]  Name: Jason Herrera MRN: [id number] Date of Birth: 07/17/1950

## 2021-05-10 ENCOUNTER — Other Ambulatory Visit: Payer: Self-pay

## 2021-05-10 ENCOUNTER — Ambulatory Visit: Payer: Medicare PPO | Admitting: Occupational Therapy

## 2021-05-10 ENCOUNTER — Encounter: Payer: Self-pay | Admitting: Occupational Therapy

## 2021-05-10 DIAGNOSIS — R251 Tremor, unspecified: Secondary | ICD-10-CM | POA: Diagnosis not present

## 2021-05-10 DIAGNOSIS — R278 Other lack of coordination: Secondary | ICD-10-CM | POA: Diagnosis not present

## 2021-05-10 DIAGNOSIS — R29818 Other symptoms and signs involving the nervous system: Secondary | ICD-10-CM

## 2021-05-10 DIAGNOSIS — R2681 Unsteadiness on feet: Secondary | ICD-10-CM

## 2021-05-10 DIAGNOSIS — M6281 Muscle weakness (generalized): Secondary | ICD-10-CM

## 2021-05-10 NOTE — Therapy (Signed)
[facility] [address], [address] [place], [place], [address] Phone: [phone]   Fax:  [phone]  Occupational Therapy Treatment  Patient Details  Name: Jason Herrera MRN: [id number] Date of Birth: 09/05/1950 Referring Provider (OT): Tat, Octaviano Battyebecca S, DO   Encounter Date: 05/10/2021   OT End of Session - 05/10/21 0940     Visit Number 8    Number of Visits 17    Date for OT Re-Evaluation 06/12/21    Authorization Type [employer]    Authorization Time Period 16 visits    Authorization - Visit Number 8    Authorization - Number of Visits 16    Progress Note Due on Visit 10    OT Start Time 0935    OT Stop Time 1020    OT Time Calculation (min) 45 min    Activity Tolerance Patient tolerated treatment well    Behavior During Therapy [facility] for tasks assessed/performed             Past Medical History:  Diagnosis Date   Back pain     Past Surgical History:  Procedure Laterality Date   HERNIA REPAIR      There were no vitals filed for this visit.   Subjective Assessment - 05/10/21 0938     Subjective  Pt reports some improvements in his back pain, but still there.    Patient Stated Goals To learn coping skills and techniques to decrease or distract from tremors    Currently in Pain? Yes    Pain Score 3     Pain Location Back    Pain Orientation Mid;Lower    Pain Descriptors / Indicators Dull;Discomfort    Pain Onset In the past 7 days    Pain Frequency Constant    Aggravating Factors  certain movements    Pain Relieving Factors heat, improving with time               GMC and dynamic standing balance with focus on large amplitude movements.  Reaching with trunk rotation, incorporating stepping pattern with focus on large amplitude movements and turning head and trunk for increased rotation.  Utilized resistive clothespins (1# to 8#) while reaching up for vertical dowel to progressively increase reach and  rotate to place on mat behind pt.  Seated trunk rotation with Focus on large amplitude movements while seated with alternating reaching down towards floor to pick up item and rotating R and L to place on surface next to pt.  Educated on carryover of reaching activity into functional tasks.                       OT Short Term Goals - 05/10/21 1054       OT SHORT TERM GOAL #1   Title Pt will demonstrate improved ease with fastening buttons as evidenced by decreasing 3 button/ unbutton time to 0:30 seconds    Baseline 0:34.53 sec   28.81 seconds   Time 4    Period Weeks    Status Achieved    Target Date 05/15/21      OT SHORT TERM GOAL #2   Title Pt will demonstrate improved UE functional use for ADLs as evidenced by increasing box/ blocks score by 4 blocks with LUE    Baseline L:54 and R: 62   L: 59   Time 4    Period Weeks    Status Achieved  OT SHORT TERM GOAL #3   Title Pt will verbalize understanding of ways to prevent future PD related complications and PD community resources.    Time 4    Period Weeks    Status Achieved               OT Long Term Goals - 04/19/21 1217       OT LONG TERM GOAL #1   Title I with PD specific HEP    Time 8    Period Weeks    Status On-going    Target Date 06/12/21      OT LONG TERM GOAL #2   Title Pt will demonstrate ability to retrieve a lightweight object at 140* shoulder flexion and -5* elbow extension with LUE to increase shoulder ROM as needed for ADLs/IADLs.    Baseline 135* and -10*    Time 8    Period Weeks    Status On-going      OT LONG TERM GOAL #3   Title Pt will verbalize understanding of adapted strategies to maximize safety and I with ADLs/ IADLs.    Time 8    Period Weeks    Status On-going                   Plan - 05/10/21 0940     Clinical Impression Statement Pt continues to demonstrate great carryover of education of big amplitude movements with fine and gross motor tasks  this session.  Pt able to tolerate increased trunk rotation and bending during therapy session this date, however reports some movements still irritate his back.  Pt receptive to recommendation for upcoming d/c from therapy to allow time to go to PD community exercise class and support group.  Pt very motivated to work through tremors and keep them at bay.    OT Occupational Profile and History Detailed Assessment- Review of Records and additional review of physical, cognitive, psychosocial history related to current functional performance    Occupational performance deficits (Please refer to evaluation for details): ADL's;IADL's;Leisure    Body Structure / Function / Physical Skills ADL;Balance;Body mechanics;Coordination;Decreased knowledge of precautions;Decreased knowledge of use of DME;Endurance;Flexibility;FMC;GMC;IADL;Mobility;Pain;ROM;Strength;Tone;UE functional use    Rehab Potential Good    Clinical Decision Making Limited treatment options, no task modification necessary    Comorbidities Affecting Occupational Performance: May have comorbidities impacting occupational performance    Modification or Assistance to Complete Evaluation  No modification of tasks or assist necessary to complete eval    OT Frequency 2x / week    OT Duration 8 weeks    OT Treatment/Interventions Self-care/ADL training;Cryotherapy;Electrical Stimulation;Moist Heat;Ultrasound;Iontophoresis;Therapeutic exercise;Neuromuscular education;Energy conservation;DME and/or AE instruction;Functional Mobility Training;Manual Therapy;Passive range of motion;Therapeutic activities;Cognitive remediation/compensation;Patient/family education;Balance training;Coping strategies training    Plan big amplitude movements, L shoulder ROM with bending and reaching as needed for IADLs.  Check goals and discuss d/c    Consulted and Agree with Plan of Care Patient             Patient will benefit from skilled therapeutic intervention in  order to improve the following deficits and impairments:   Body Structure / Function / Physical Skills: ADL, Balance, Body mechanics, Coordination, Decreased knowledge of precautions, Decreased knowledge of use of DME, Endurance, Flexibility, FMC, GMC, IADL, Mobility, Pain, ROM, Strength, Tone, UE functional use       Visit Diagnosis: Unsteadiness on feet  Other symptoms and signs involving the nervous system  Muscle weakness (generalized)  Other lack of coordination  Tremor    Problem List Patient Active Problem List   Diagnosis Date Noted   Parkinson's disease (HCC) 03/22/2021    Rosalio Loud, OT 05/10/2021, 10:54 AM  [place] [facility] [address], [address] [place], [place], [address] Phone: [phone]   Fax:  [phone]  Name: Jason Herrera MRN: [id number] Date of Birth: 03/09/1951

## 2021-05-10 NOTE — Therapy (Deleted)
OUTPATIENT OCCUPATIONAL THERAPY TREATMENT NOTE   Patient Name: Jason Herrera MRN: [id number] DOB:1950/06/09, 71 y.o., male Today's Date: 05/10/2021  PCP: Patient, No Pcp Per (Inactive) REFERRING PROVIDER: TatEustace Quail, DO    Past Medical History:  Diagnosis Date   Back pain    Past Surgical History:  Procedure Laterality Date   HERNIA REPAIR     Patient Active Problem List   Diagnosis Date Noted   Parkinson's disease ([place]) 03/22/2021    REFERRING DIAG: Parkinson's disease   THERAPY DIAG:  Other symptoms and signs involving the nervous system  - Primary R29.818   Unsteadiness on feet  R26.81   Muscle weakness (generalized)  M62.81   Other lack of coordination  R27.8   Tremor  R25.1   Other symptoms and signs involving the musculoskeletal system  R29.898      PERTINENT HISTORY: N/A  PRECAUTIONS: fall  SUBJECTIVE: ***  PAIN:  Are you having pain? {yes/no:20286} VAS scale: ***/10 Pain location: *** Pain orientation: {Pain Orientation:25161}  PAIN TYPE: {type:313116} Pain description: {PAIN DESCRIPTION:21022940}  Aggravating factors: *** Relieving factors: ***    OBJECTIVE:   TODAY'S TREATMENT: ***  OT Education - 04/17/21 1522       Education Details initiated education on large amplitude movements     Person(s) Educated Patient     Methods Explanation     Comprehension Need further instruction                     OT Short Term Goals - 04/17/21 1544                OT SHORT TERM GOAL #1    Title Pt will demonstrate improved ease with fastening buttons as evidenced by decreasing 3 button/ unbutton time to 0:30 seconds     Baseline 0:34.53 sec     Time 4     Period Weeks     Status New     Target Date 05/15/21          OT SHORT TERM GOAL #2    Title Pt will demonstrate improved UE functional use for ADLs as evidenced by increasing box/ blocks score by 4 blocks with LUE     Baseline L:54 and R: 62     Time 8     Period Weeks      Status New          OT SHORT TERM GOAL #3    Title Pt will verbalize understanding of ways to prevent future PD related complications and PD community resources.     Time 8     Period Weeks     Status New                       OT Long Term Goals - 04/17/21 1543                OT LONG TERM GOAL #1    Title I with PD specific HEP     Time 8     Period Weeks     Status New     Target Date 06/12/21          OT LONG TERM GOAL #2    Title Pt will demonstrate ability to retrieve a lightweight object at 140* shoulder flexion and -5* elbow extension with LUE to increase shoulder ROM as needed for ADLs/IADLs.     Baseline 135* and -10*  Time 8     Period Weeks     Status New          OT LONG TERM GOAL #3    Title Pt will verbalize understanding of adapted strategies to maximize safety and I with ADLs/ IADLs.     Time 8     Period Weeks     Status New                               Plan - 04/17/21 1535       Clinical Impression Statement Pt is a 71 y/o male who presents to OP OT due to increased tremors at rest in LUE/LLE also mild tremors and decreased coordination in LUE with [place] tasks, decreased L shoulder and elbow ROM, and decreased strength in L hand compared to R. Pt currently lives with wife and is retired, but currently helping son renovate home. Pt will benefit from skilled occupational therapy services to address strength and coordination, ROM, pain management, balance, GM/FM control, introduction of compensatory strategies/AE prn, and implementation of an HEP to improve participation and safety during ADLs, IADLs, and quality of living.     OT Occupational Profile and History Detailed Assessment- Review of Records and additional review of physical, cognitive, psychosocial history related to current functional performance     Occupational performance deficits (Please refer to evaluation for details): ADL's;IADL's;Leisure     Body Structure / Function /  Physical Skills ADL;Balance;Body mechanics;Coordination;Decreased knowledge of precautions;Decreased knowledge of use of DME;Endurance;Flexibility;FMC;GMC;IADL;Mobility;Pain;ROM;Strength;Tone;UE functional use     Rehab Potential Good     Clinical Decision Making Limited treatment options, no task modification necessary     Comorbidities Affecting Occupational Performance: May have comorbidities impacting occupational performance     Modification or Assistance to Complete Evaluation  No modification of tasks or assist necessary to complete eval     OT Frequency 2x / week     OT Duration 8 weeks     OT Treatment/Interventions Self-care/ADL training;Cryotherapy;Electrical Stimulation;Moist Heat;Ultrasound;Iontophoresis;Therapeutic exercise;Neuromuscular education;Energy conservation;DME and/or AE instruction;Functional Mobility Training;Manual Therapy;Passive range of motion;Therapeutic activities;Cognitive remediation/compensation;Patient/family education;Balance training;Coping strategies training     Plan big amplitude movements, L shoulder ROM, strength, GMC/FMC     Consulted and Agree with Plan of Care Patient                   Patient will benefit from skilled therapeutic intervention in order to improve the following deficits and impairments:   Body Structure / Function / Physical Skills: ADL, Balance, Body mechanics, Coordination, Decreased knowledge of precautions, Decreased knowledge of use of DME, Endurance, Flexibility, FMC, GMC, IADL, Mobility, Pain, ROM, Strength, Tone, UE functional use       Brendaliz Kuk, [facility] 05/10/2021, 8:06 AM

## 2021-05-14 DIAGNOSIS — H2513 Age-related nuclear cataract, bilateral: Secondary | ICD-10-CM | POA: Diagnosis not present

## 2021-05-15 ENCOUNTER — Ambulatory Visit: Payer: Medicare PPO | Admitting: Occupational Therapy

## 2021-05-15 ENCOUNTER — Other Ambulatory Visit: Payer: Self-pay

## 2021-05-15 ENCOUNTER — Encounter: Payer: Self-pay | Admitting: Occupational Therapy

## 2021-05-15 DIAGNOSIS — R278 Other lack of coordination: Secondary | ICD-10-CM

## 2021-05-15 DIAGNOSIS — R2681 Unsteadiness on feet: Secondary | ICD-10-CM

## 2021-05-15 DIAGNOSIS — M6281 Muscle weakness (generalized): Secondary | ICD-10-CM

## 2021-05-15 DIAGNOSIS — R251 Tremor, unspecified: Secondary | ICD-10-CM | POA: Diagnosis not present

## 2021-05-15 DIAGNOSIS — R29818 Other symptoms and signs involving the nervous system: Secondary | ICD-10-CM | POA: Diagnosis not present

## 2021-05-15 NOTE — Patient Instructions (Addendum)
Optimal Fitness Program after Therapy for People with Parkinson's Disease  1)  Therapy Home Exercise Program  -Do these Exercises DAILY as instructed by your therapist  -Big, deliberate effort with exercises  -These exercises are important to perform consistently, even when therapist has  finished, because these therapy exercises often address your specific  Parkinson's difficulties   2)  Walking  -  Work up to walking 3-5 times per week, 20-30 minutes per day  -This can be done at home, driveway, quiet street or an indoor track  -Focus should be on your Best posture, arm swing, step length for your best  walking pattern  3)  Aerobic Exercise  -Work up to 3-5 times per week, 30 minutes per day  -This can be stationary bike, seated stepper machine, elliptical machine  -Work up to 7-8/10 intensity during the exercise, at minimal to moderate     Resistance       Ways to prevent future Parkinson's related complications: 1.   Exercise regularly,  2.   Focus on BIGGER movements during daily activities- really reach overhead, straighten elbows and extend fingers 3.   When dressing or reaching for your seatbelt make sure to use your body to assist by twisting while you reach-this can help to minimize stress on the shoulder and reduce the risk of a rotator cuff tear 4.   Swing your arms when you walk! People with PD are at increased risk for frozen shoulder and swinging your arms can reduce this risk.

## 2021-05-15 NOTE — Therapy (Signed)
[facility] [address] [place], [place], [address] Phone: [phone]   Fax:  [phone]  Occupational Therapy Treatment  Patient Details  Name: Jason Herrera MRN: [id number] Date of Birth: November 02, 1950 Referring Provider (OT): Tat, Octaviano Batty, DO   Encounter Date: 05/15/2021   OT End of Session - 05/15/21 1100     Visit Number 9    Number of Visits 17    Date for OT Re-Evaluation 06/12/21    Authorization Type [employer]    Authorization Time Period 16 visits    Authorization - Visit Number 8    Authorization - Number of Visits 16    Progress Note Due on Visit 10    OT Start Time 1019    OT Stop Time 1053    OT Time Calculation (min) 34 min    Activity Tolerance Patient tolerated treatment well    Behavior During Therapy WFL for tasks assessed/performed             Past Medical History:  Diagnosis Date   Back pain     Past Surgical History:  Procedure Laterality Date   HERNIA REPAIR      There were no vitals filed for this visit.   Subjective Assessment - 05/15/21 1022     Subjective  Pt reports having split exercises into morning and evening exercises to allow for decreased overstrain to back.    Patient Stated Goals To learn coping skills and techniques to decrease or distract from tremors    Currently in Pain? No/denies    Pain Onset In the past 7 days                [facility] OT Assessment - 05/15/21 0001       AROM   Right Shoulder Flexion 142 Degrees    Left Shoulder Flexion 140 Degrees                ADL/self-care: Engaged in discussion and reiterated education in regards to big amplitude movements during functional tasks.  Pt reports carryover into meal prep tasks and noting improved ROM in shoulder and functional reach.               OT Education - 05/15/21 1059     Education Details Educated on functional tasks as exercise with focus on large amplitude movements     Person(s) Educated Patient    Methods Explanation    Comprehension Verbalized understanding              OT Short Term Goals - 05/10/21 1054       OT SHORT TERM GOAL #1   Title Pt will demonstrate improved ease with fastening buttons as evidenced by decreasing 3 button/ unbutton time to 0:30 seconds    Baseline 0:34.53 sec   28.81 seconds   Time 4    Period Weeks    Status Achieved    Target Date 05/15/21      OT SHORT TERM GOAL #2   Title Pt will demonstrate improved UE functional use for ADLs as evidenced by increasing box/ blocks score by 4 blocks with LUE    Baseline L:54 and R: 62   L: 59   Time 4    Period Weeks    Status Achieved      OT SHORT TERM GOAL #3   Title Pt will verbalize understanding of ways to prevent future PD related complications and PD community resources.  Time 4    Period Weeks    Status Achieved               OT Long Term Goals - 05/15/21 1033       OT LONG TERM GOAL #1   Title I with PD specific HEP    Time 8    Period Weeks    Status Achieved    Target Date 06/12/21      OT LONG TERM GOAL #2   Title Pt will demonstrate ability to retrieve a lightweight object at 140* shoulder flexion and -5* elbow extension with LUE to increase shoulder ROM as needed for ADLs/IADLs.    Baseline 135* and -10*   140* and -5*   Time 8    Period Weeks    Status Achieved      OT LONG TERM GOAL #3   Title Pt will verbalize understanding of adapted strategies to maximize safety and I with ADLs/ IADLs.    Time 8    Period Weeks    Status Achieved                   Plan - 05/15/21 1100     Clinical Impression Statement Pt is doing well with completing exercises at home, reports routine of morning and evening sets to promote completion and carryover of exercises.  Pt demonstrating improved LUE ROM and reports decreased pain/stiffness at end range.  Pt is able to complete exercises at this time, therefore plan to hold off on remaining  therapy sessions at this time.  Pt to schedule screen in 6 months.    OT Occupational Profile and History Detailed Assessment- Review of Records and additional review of physical, cognitive, psychosocial history related to current functional performance    Occupational performance deficits (Please refer to evaluation for details): ADL's;IADL's;Leisure    Body Structure / Function / Physical Skills ADL;Balance;Body mechanics;Coordination;Decreased knowledge of precautions;Decreased knowledge of use of DME;Endurance;Flexibility;FMC;GMC;IADL;Mobility;Pain;ROM;Strength;Tone;UE functional use    Rehab Potential Good    Clinical Decision Making Limited treatment options, no task modification necessary    Comorbidities Affecting Occupational Performance: May have comorbidities impacting occupational performance    Modification or Assistance to Complete Evaluation  No modification of tasks or assist necessary to complete eval    OT Frequency 2x / week    OT Duration 8 weeks    OT Treatment/Interventions Self-care/ADL training;Cryotherapy;Electrical Stimulation;Moist Heat;Ultrasound;Iontophoresis;Therapeutic exercise;Neuromuscular education;Energy conservation;DME and/or AE instruction;Functional Mobility Training;Manual Therapy;Passive range of motion;Therapeutic activities;Cognitive remediation/compensation;Patient/family education;Balance training;Coping strategies training    Plan hold for remainder of cert if needs arise.  Plan for screen in 6 months.    Consulted and Agree with Plan of Care Patient             Patient will benefit from skilled therapeutic intervention in order to improve the following deficits and impairments:   Body Structure / Function / Physical Skills: ADL, Balance, Body mechanics, Coordination, Decreased knowledge of precautions, Decreased knowledge of use of DME, Endurance, Flexibility, FMC, GMC, IADL, Mobility, Pain, ROM, Strength, Tone, UE functional use       Visit  Diagnosis: Unsteadiness on feet  Other symptoms and signs involving the nervous system  Muscle weakness (generalized)  Other lack of coordination    Problem List Patient Active Problem List   Diagnosis Date Noted   Parkinson's disease (HCC) 03/22/2021    Rosalio LoudHOXIE, Morrison Mcbryar, OT 05/15/2021, 11:03 AM  [facility] [address] [place], [place], [address] Phone: [phone]   Fax:  [phone]  Name: Jason Herrera MRN: [id number] Date of Birth: 1950/11/22

## 2021-05-17 ENCOUNTER — Encounter: Payer: Medicare PPO | Admitting: Occupational Therapy

## 2021-05-22 ENCOUNTER — Encounter: Payer: Medicare PPO | Admitting: Occupational Therapy

## 2021-05-24 ENCOUNTER — Encounter: Payer: Medicare PPO | Admitting: Occupational Therapy

## 2021-06-08 ENCOUNTER — Encounter: Payer: Self-pay | Admitting: Occupational Therapy

## 2021-06-08 NOTE — Therapy (Deleted)
[facility] [address], [place], [place], [address] Phone: [phone]   Fax:  [phone]  Patient Details  Name: Jason Herrera MRN: [id number] Date of Birth: 02-Jul-1950 Referring Provider:  No ref. provider found  Encounter Date: 06/08/2021  OCCUPATIONAL THERAPY DISCHARGE SUMMARY  Visits from Start of Care: 9  Current functional level related to goals / functional outcomes: Pt met 3 of 3 LTGs.  Pt reports feeling pleased with current status and able to complete HEP at home as recommended.  Pt reports keeping busy with hobbies and assisting son with house renovations.  Pt aware of scheduled screens in July.   Remaining deficits: Mild tremors, pt with understanding of diagnosis and use of large amplitude movements.   Education / Equipment: Pt educated on large amplitude movements, provided with HEP with focus on [facility] and strengthening as well as ROM.  Pt has been educated on community Parkinson's classes and resources.   Patient agrees to discharge. Patient goals were met. Patient is being discharged due to meeting the stated rehab goals.Marland Kitchen      Simonne Come, OT 06/08/2021, 9:25 AM  [facility] [address] [place], [place], [address] Phone: [phone]   Fax:  [phone]

## 2021-06-08 NOTE — Therapy (Signed)
[facility] [address], [place], [place], [address] Phone: [phone]   Fax:  [phone]  Patient Details  Name: Jason Herrera MRN: [id number] Date of Birth: 11-28-1950 Referring Provider:  No ref. provider found  Encounter Date: 06/08/2021  OCCUPATIONAL THERAPY DISCHARGE SUMMARY  Visits from Start of Care: 9  Current functional level related to goals / functional outcomes: Pt met 3 of 3 LTGs.  Pt reports feeling pleased with current status and able to complete HEP at home as recommended.  Pt reports keeping busy with hobbies and assisting son with house renovations.  Pt aware of scheduled screens in July.   Remaining deficits: Mild tremors, pt with understanding of diagnosis and use of large amplitude movements.   Education / Equipment: Pt educated on large amplitude movements, provided with HEP with focus on [facility] and strengthening as well as ROM.  Pt has been educated on community Parkinson's classes and resources.   Patient agrees to discharge. Patient goals were met. Patient is being discharged due to meeting the stated rehab goals.Marland Kitchen    Simonne Come, OT 06/08/2021, 12:04 PM  [facility] [address], [place], [place], [address] Phone: [phone]   Fax:  [phone]

## 2021-07-06 DIAGNOSIS — Z23 Encounter for immunization: Secondary | ICD-10-CM | POA: Diagnosis not present

## 2021-07-06 DIAGNOSIS — E7800 Pure hypercholesterolemia, unspecified: Secondary | ICD-10-CM | POA: Diagnosis not present

## 2021-07-06 DIAGNOSIS — Z0000 Encounter for general adult medical examination without abnormal findings: Secondary | ICD-10-CM | POA: Diagnosis not present

## 2021-07-06 DIAGNOSIS — Z125 Encounter for screening for malignant neoplasm of prostate: Secondary | ICD-10-CM | POA: Diagnosis not present

## 2021-07-06 DIAGNOSIS — E669 Obesity, unspecified: Secondary | ICD-10-CM | POA: Diagnosis not present

## 2021-09-05 NOTE — Progress Notes (Signed)
? ? ?  Assessment/Plan:  ? ?1.  Parkinsons Disease ? -Continue carbidopa levodopa 25/100, 1 tablet 3 times per day. ? -he has appt with Dr. Sharyn Lull.  He knows Parkinsons Disease increases risk for melanoma.  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   ? -discussed increasing exercise ? ? ?Subjective:  ? ?Jason Herrera was seen today in follow up for Parkinsons disease, diagnosed last visit and started on levodopa.  My previous records were reviewed prior to todays visit as well as outside records available to me.  Wife with patient and supplements the history.  Patient is tolerating medication well, without side effects.  Pt denies falls.  Therapy notes are reviewed.  Pt denies lightheadedness, near syncope.  No hallucinations.  Mood has been good. ? ?Current prescribed movement disorder medications: ?Carbidopa/levodopa 25/100, 1 tablet 3 times per day ? ? ? ?ALLERGIES:  No Known Allergies ? ?CURRENT MEDICATIONS:  ?Current Meds  ?Medication Sig  ? Calcium Carb-Cholecalciferol 500-2.5 MG-MCG CHEW Chew by mouth.  ? calcium carbonate (TUMS - DOSED IN MG ELEMENTAL CALCIUM) 500 MG chewable tablet Chew 1-2 tablets by mouth 3 (three) times daily as needed. For indigestion  ? carbidopa-levodopa (SINEMET IR) 25-100 MG tablet Take 1 tablet by mouth 3 (three) times daily.  ? ? ? ?Objective:  ? ?PHYSICAL EXAMINATION:   ? ?VITALS:   ?Vitals:  ? 09/06/21 1511  ?BP: (!) 117/24  ?Pulse: 62  ?SpO2: 96%  ?Weight: 267 lb 3.2 oz (121.2 kg)  ?Height: 5\' 10"  (1.778 m)  ? ? ?GEN:  The patient appears stated age and is in NAD. ?HEENT:  Normocephalic, atraumatic.  The mucous membranes are moist.  ? ?Neurological examination: ? ?Orientation: The patient is alert and oriented x3. ?Cranial nerves: There is good facial symmetry with min facial hypomimia. The speech is fluent and clear. Soft palate rises symmetrically and there is no tongue deviation. Hearing is intact to conversational tone. ?Sensation: Sensation is intact to light touch  throughout ?Motor: Strength is at least antigravity x4. ? ?Movement examination: ?Tone: There is nl tone in the bilateral upper extremities.  The tone in the lower extremities is nl.  ?Abnormal movements: there is very rare tremor on the L ?Coordination:  There is no decremation, with any form of RAMS, including alternating supination and pronation of the forearm, hand opening and closing, finger taps, heel taps and toe taps bilaterally ?Gait and Station: The patient has mild difficulty arising out of a deep-seated chair without the use of the hands. The patient's stride length is good with arm swing bilaterally. ? ?I have reviewed and interpreted the following labs independently ? ?  Chemistry   ?   ?Component Value Date/Time  ? NA 137 07/20/2011 2344  ? K 4.4 07/20/2011 2344  ? CL 101 07/20/2011 2344  ? CO2 20 07/20/2011 2344  ? BUN 27 (H) 07/20/2011 2344  ? CREATININE 1.66 (H) 07/20/2011 2344  ?    ?Component Value Date/Time  ? CALCIUM 10.1 07/20/2011 2344  ? ALKPHOS 87 07/20/2011 2344  ? AST 34 07/20/2011 2344  ? ALT 40 07/20/2011 2344  ? BILITOT 0.8 07/20/2011 2344  ?  ? ? ? ?Lab Results  ?Component Value Date  ? WBC 13.7 (H) 07/20/2011  ? HGB 18.1 (H) 07/20/2011  ? HCT 51.5 07/20/2011  ? MCV 85.7 07/20/2011  ? PLT 272 07/20/2011  ? ? ?No results found for: TSH ? ? ? ? ?

## 2021-09-06 ENCOUNTER — Encounter: Payer: Self-pay | Admitting: Neurology

## 2021-09-06 ENCOUNTER — Ambulatory Visit: Payer: Medicare PPO | Admitting: Neurology

## 2021-09-06 VITALS — BP 117/24 | HR 62 | Ht 70.0 in | Wt 267.2 lb

## 2021-09-06 DIAGNOSIS — G20 Parkinson's disease: Secondary | ICD-10-CM | POA: Diagnosis not present

## 2021-09-06 MED ORDER — CARBIDOPA-LEVODOPA 25-100 MG PO TABS: 1.0000 | ORAL_TABLET | Freq: Three times a day (TID) | ORAL | 1 refills | Status: DC

## 2021-09-06 NOTE — Patient Instructions (Signed)
Local and Online Resources for Power over Parkinson's Group ?May 2023 ? ?LOCAL [place] PARKINSON'S GROUPS  ?Power over Parkinson's Group:   ?Power Over Parkinson's Patient Education Group will be Wednesday, May 10th-*Hybrid meting*- in person at [place] [facility] location and via WEBEX at 2:00 pm.   ?Upcoming Power over Parkinson's Meetings:  2nd Wednesdays of the month at 2 pm:  May 10th, June 14th, July 12th ?Contact Amy Marriott at [email or web address] if interested in participating in this group ?Parkinson's Care Partners Group:    3rd Mondays, Contact Misty Paladino ?Atypical Parkinsonian Patient Group:   4th Wednesdays, Contact Misty Paladino ?If you are interested in participating in these groups with Misty, please contact her directly for how to join those meetings.  Her contact information is [email or web address].   ? ?LOCAL EVENTS AND NEW OFFERINGS ?Moving Day [place]:  Saturday, May 6th, 9:30 am at [facility] [address], [place], [place]. Participate in Moving Day as a way to ?honor loved ones, raise funds, fight Parkinson's disease, and celebrate movement.?  Register today at www.MovingDay[place].org ?[place] [employer]!  Play Ball!  Join us for home game for a fun evening to bring awareness of Parkinson's and raise funds for our Movement Disorder Funds. Rescheduled to May 11th  6:30 pm [address] To purchase tickets:  [email or web address] ?Parkinson's T-shirts for sale!  Designed by a local group member, with funds going to Movement Disorders Fund.  $25.00  Contact Misty to purchase  ?New PWR! Moves Community Fitness Instructor-Led Class offering at [employer]!  Wednesdays 1-2 pm, starting April 12th.   Contact Susan Laney, Fitness Manager at [employer].  [email or web address] ? ?ONLINE EDUCATION AND SUPPORT ?Parkinson Foundation:  www.parkinson.org ?PD Health at Home continues:  Mindfulness  Mondays, Wellness Wednesdays, Fitness Fridays  ?Upcoming Education:  ?Understanding Gene and Cell-Based Therapies in Parkinson's.  Wednesday, May 10th at 1:00 pm ?Additional Education offerings virtually through their website-upcoming topics include Palliative Care/Hospice and PD, Sleep and PD ?Register for expert briefings (webinars) at https://www.parkinson.org/resources-support/online-education/expert-briefings-webinars ?Please check out their website to sign up for emails and see their full online offerings ? ? ?[employer] Foundation:  www.michaeljfox.org  ?Third Thursday Webinars:  On the third Thursday of every month at 12 p.m. ET, join our free live webinars to learn about various aspects of living with Parkinson's disease and our work to speed medical breakthroughs. ?Upcoming Webinar: Get Moving: Exercising for a Healthy Brain.  Thursday, May 18th  at  12 noon. ?Check out additional information on their website to see their full online offerings ? ?Davis Phinney Foundation:  www.davisphinneyfoundation.org ?Upcoming Webinar:   Stay tuned ?Webinar Series:  Living with Parkinson's Meetup.   Third Thursdays each month, 3 pm ?Care Partner Monthly Meetup.  With Connie Carpenter Phinney.  First Tuesday of each month, 2 pm ?Check out additional information to Live Well Today on their website ? ?Parkinson and Movement Disorders (PMD) Alliance:  www.pmdalliance.org ?[employer] Online:  Online Education Events ?Sign up for emails, which are sent weekly to give you updates on programming and online offerings ? ?Parkinson's Association of [place]:  www.parkinsonassociation.org ?Information on online support groups, education events, and online exercises including Yoga, Parkinson's exercises and more-LOTS of information on links to PD resources and online events ?Virtual Support Group through Parkinson's Association of [place]; next one is scheduled for Wednesday, May 3rd at 2 pm. (These are typically  scheduled for the 1st Wednesday of the month at 2 pm).  Visit website for details. ?MOVEMENT AND   EXERCISE OPPORTUNITIES ?Parkinson's DRUMMING Classes/Music Therapy with Jane Maydian:  This is a returning class and it's FREE!  2nd Mondays, continuing May 8th, 11:00 at the [employer] [facility].  Contact *Misty [place]-Paladino at [email or web address] or Jane Maydian at [phone] or [email or web address]  ?PWR! Moves Classes at [facility].  Wednesdays 10 and 11 am.   Contact Amy Marriott, PT [email or web address] if interested. ?NEW PWR! Moves Class offering at [employer].  Wednesdays 1-2 pm, starting April 12th.  Contact Susan Laney, Fitness Manager at [employer].  [email or web address] ?Here is a link to the PWR!Moves classes on Zoom from [place] Parkinson's Foundation - Daily Mon-Sat at 10:00. Via Zoom, FREE and open to all.  There is also a link below via Facebook if you use that platform. ? ?https://www.parkinsonsmi.org/mpf-programs/exercise-and-movement-activities ?https://www.facebook.com/ParkinsonsMI.org/posts/pwr-moves-exercise-class-parkinson-wellness-recovery-online-with-angee-ludwa-pt-/10156827878021813/ ? ?Parkinson's Wellness Recovery (PWR! Moves)  www.pwr4life.org ?Info on the PWR! Virtual Experience:  You will have access to our expertise through self-assessment, guided plans that start with the PD-specific fundamentals, educational content, tips, Q&A with an expert, and a growing library of PD-specific pre-recorded and live exercise classes of varying types and intensity - both physical and cognitive! If that is not enough, we offer 1:1 wellness consultations (in-person or virtual) to personalize your PWR! Virtual Experience.  ?Parkinson Foundation Fitness Fridays:  ?As part of the PD Health @ Home program, this free video series focuses each week on one aspect of fitness designed to support people living with  Parkinson's.  These weekly videos highlight the Parkinson Foundation recent fitness guidelines for people with Parkinson's disease. ?www.parkinson.org/resources-support/online-education/pdhealth#ff ?Dance for PD website is offering free, live-stream classes throughout the week, as well as links to digital library of classes:  https://danceforparkinsons.org/ ?Virtual dance and Pilates for Parkinson's classes: Click on the Community Tab> Parkinson's Movement Initiative Tab.  To register for classes and for more information, visit www.americandancefestival.org and click the ?community? tab.  ?[employer] Parkinson's Cycling Classes  ?[employer]:  Thursdays @ Noon-Live classes at [employer] (Contact Margaret Hazen at [email or web address] or [phone]) ?[employer]: Virtual Classes Mondays and Thursdays /Live classes Tuesday, Wednesday and Thursday (contact Marlee at [email or web address]  or [phone]) ?[place] Steady Boxing ?Varied levels of classes are offered Mondays, Tuesdays and Thursdays at [employer].  ?Stretching with Maria weekly class is also offered for people with Parkinson's ?To observe a class or for more information, call [phone] or email Hillary Savage at [email or web address] ?ADDITIONAL SUPPORT AND RESOURCES ?[employer]:Online Caregiver Education Opportunities:  www.well-springsolutions.org/caregiver-education/caregiver-support-group.  You may also contact Jodi Kolada at [email or web address] or [phone].    ?[employer] Navigator:  Just1Navigator program, a free service to help individuals and families through the journey of determining care for older adults.  The ?Navigator? is a social worker, Nicole Reynolds, who will speak with a prospective client and/or loved ones to provide an assessment of the situation and a set of recommendations for a personalized care plan -- all free of charge, and whether [employer]  offers the needed service or not. If the need is not a service we provide, we are well-connected with reputable programs in town that we can refer you to.  www.well-springsolutions.org or to speak with the Navigator,

## 2021-09-19 DIAGNOSIS — C44311 Basal cell carcinoma of skin of nose: Secondary | ICD-10-CM | POA: Diagnosis not present

## 2021-09-19 DIAGNOSIS — L820 Inflamed seborrheic keratosis: Secondary | ICD-10-CM | POA: Diagnosis not present

## 2021-09-19 DIAGNOSIS — D485 Neoplasm of uncertain behavior of skin: Secondary | ICD-10-CM | POA: Diagnosis not present

## 2021-09-19 DIAGNOSIS — D0462 Carcinoma in situ of skin of left upper limb, including shoulder: Secondary | ICD-10-CM | POA: Diagnosis not present

## 2021-11-12 DIAGNOSIS — D0462 Carcinoma in situ of skin of left upper limb, including shoulder: Secondary | ICD-10-CM | POA: Diagnosis not present

## 2021-11-15 ENCOUNTER — Ambulatory Visit: Payer: Medicare PPO | Admitting: Occupational Therapy

## 2021-11-15 ENCOUNTER — Ambulatory Visit: Payer: Medicare PPO | Attending: Neurology | Admitting: Physical Therapy

## 2021-11-15 DIAGNOSIS — R29818 Other symptoms and signs involving the nervous system: Secondary | ICD-10-CM | POA: Insufficient documentation

## 2021-11-15 DIAGNOSIS — R251 Tremor, unspecified: Secondary | ICD-10-CM

## 2021-11-15 DIAGNOSIS — R2689 Other abnormalities of gait and mobility: Secondary | ICD-10-CM | POA: Insufficient documentation

## 2021-11-15 NOTE — Therapy (Signed)
[facility] [address] [place], [place], [address] Phone: [phone]   Fax:  [phone]  Patient Details  Name: Jason Herrera MRN: [id number] Date of Birth: 1950/08/28 Referring Provider:  No ref. provider found  Encounter Date: 11/15/2021  Occupational Therapy Parkinson's Disease Screen  Hand dominance:  Right   Physical Performance Test item #2 (simulated eating):  11.8 sec  Fastening/unfastening 3 buttons in:  23.94 sec  Box & Blocks Test:   RUE  62 blocks        LUE  55 blocks  Other Comments:  Pt notes increased episodes of vertigo with up/down and direction changes.  Pt also notes some increase in tremors at rest but is not noticing an impact in his quality of life.  Pt does not require occupational therapy services at this time.  Recommended occupational therapy screen in   6-9 months   Anzley Dibbern, OTR/L 11/15/2021, 11:58 AM  [facility] [address] [place], [place], [address] Phone: [phone]   Fax:  [phone]

## 2021-11-15 NOTE — Therapy (Signed)
[facility] [address] [place], [place], [address] Phone: [phone]   Fax:  [phone]  Patient Details  Name: Jason Herrera MRN: [id number] Date of Birth: November 10, 1950 Referring Provider:  No ref. provider found  Encounter Date: 11/15/2021  Physical Therapy Parkinson's Disease Screen  Pt reports no falls, no changes in medications, occasional vertigo with quick standing and changes of directions.  Not doing exercises specifically, but staying very active.    Timed Up and Go test:  11.34 sec (compared to 9.93 sec)  10 meter walk test: 8 sec (4.1 ft/sec) (compared to 3.81 ft/sec)  5 time sit to stand test: 12.78 sec (compared to 13.15 sec)   Patient does not require Physical Therapy services at this time.  Recommend Physical Therapy screen in 6-9 months.   Evy Lutterman W., PT 11/15/2021, 7:41 AM  [facility] [address] [place], [place], [address] Phone: [phone]   Fax:  [phone]

## 2022-03-07 ENCOUNTER — Other Ambulatory Visit: Payer: Self-pay | Admitting: Neurology

## 2022-03-11 NOTE — Progress Notes (Unsigned)
Assessment/Plan:   1.  Parkinsons Disease  -Continue carbidopa levodopa 25/100, 1 tablet 3 times per day.  -he has appt with Dr. Renda Rolls.  He knows Parkinsons Disease increases risk for melanoma.  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    -discussed increasing exercise   Subjective:   Jason Herrera was seen today in follow up for Parkinsons disease, diagnosed last visit and started on levodopa.  My previous records were reviewed prior to todays visit as well as outside records available to me.  Wife with patient and supplements the history. Doing well since last visit.  No new med issues.  Pt denies falls.  Pt denies lightheadedness, near syncope.  No hallucinations.  Mood has been good.  Current prescribed movement disorder medications: Carbidopa/levodopa 25/100, 1 tablet 3 times per day    ALLERGIES:  No Known Allergies  CURRENT MEDICATIONS:  Current Meds  Medication Sig   Calcium Carb-Cholecalciferol 500-2.5 MG-MCG CHEW Chew by mouth.   calcium carbonate (TUMS - DOSED IN MG ELEMENTAL CALCIUM) 500 MG chewable tablet Chew 1-2 tablets by mouth 3 (three) times daily as needed. For indigestion   carbidopa-levodopa (SINEMET IR) 25-100 MG tablet Take 1 tablet by mouth 3 (three) times daily.     Objective:   PHYSICAL EXAMINATION:    VITALS:   Vitals:   09/06/21 1511  BP: (!) 117/24  Pulse: 62  SpO2: 96%  Weight: 267 lb 3.2 oz (121.2 kg)  Height: 5\' 10"  (1.778 m)    GEN:  The patient appears stated age and is in NAD. HEENT:  Normocephalic, atraumatic.  The mucous membranes are moist.   Neurological examination:  Orientation: The patient is alert and oriented x3. Cranial nerves: There is good facial symmetry with min facial hypomimia. The speech is fluent and clear. Soft palate rises symmetrically and there is no tongue deviation. Hearing is intact to conversational tone. Sensation: Sensation is intact to light touch throughout Motor: Strength is at least  antigravity x4.  Movement examination: Tone: There is nl tone in the bilateral upper extremities.  The tone in the lower extremities is nl.  Abnormal movements: there is very rare tremor on the L Coordination:  There is no decremation, with any form of RAMS, including alternating supination and pronation of the forearm, hand opening and closing, finger taps, heel taps and toe taps bilaterally Gait and Station: The patient has mild difficulty arising out of a deep-seated chair without the use of the hands. The patient's stride length is good with arm swing bilaterally.  I have reviewed and interpreted the following labs independently    Chemistry      Component Value Date/Time   NA 137 07/20/2011 2344   K 4.4 07/20/2011 2344   CL 101 07/20/2011 2344   CO2 20 07/20/2011 2344   BUN 27 (H) 07/20/2011 2344   CREATININE 1.66 (H) 07/20/2011 2344      Component Value Date/Time   CALCIUM 10.1 07/20/2011 2344   ALKPHOS 87 07/20/2011 2344   AST 34 07/20/2011 2344   ALT 40 07/20/2011 2344   BILITOT 0.8 07/20/2011 2344       Lab Results  Component Value Date   WBC 13.7 (H) 07/20/2011   HGB 18.1 (H) 07/20/2011   HCT 51.5 07/20/2011   MCV 85.7 07/20/2011   PLT 272 07/20/2011    No results found for: TSH    Total time spent on today's visit was *** minutes, including  both face-to-face time and nonface-to-face time.  Time included that spent on review of records (prior notes available to me/labs/imaging if pertinent), discussing treatment and goals, answering patient's questions and coordinating care.

## 2022-03-12 ENCOUNTER — Ambulatory Visit: Payer: Medicare PPO | Admitting: Neurology

## 2022-03-12 ENCOUNTER — Encounter: Payer: Self-pay | Admitting: Neurology

## 2022-03-12 VITALS — BP 115/75 | HR 84 | Ht 70.0 in | Wt 271.0 lb

## 2022-03-12 DIAGNOSIS — G20A1 Parkinson's disease without dyskinesia, without mention of fluctuations: Secondary | ICD-10-CM | POA: Diagnosis not present

## 2022-03-12 NOTE — Patient Instructions (Signed)
Local and Online Resources for Power over Parkinson's Group  November 2023    LOCAL [place] PARKINSON'S GROUPS   Power over Parkinson's Group:    Power Over Parkinson's Patient Education Group will be Wednesday, November 8th-*Hybrid meting*- in person at [facility] location and via [facility], 2:00-3:00 pm.   Starting in November, Power over [employer] and Care Partner Groups will meet together, with plans for separate break out session for caregivers (*this will be evolving over the next few months) Upcoming Power over Parkinson's Meetings/Care Partner Support:  2nd Wednesdays of the month at 2 pm:   November 8th, December 13th  [place] at [email or web address] if interested in participating in this group    [place] and Fall Prevention Workshop.  Thursday, November 9th 1-2pm, [employer], [employer].  Register with Vonna Kotyk at [email or web address] or [phone] New PWR! Moves Dynegy Instructor-Led Classes offering at [employer]!  TUESDAYS and Wednesdays 1-2 pm.   Contact Vonna Kotyk at  [email or web address]  or [phone] (Tuesday classes are modified for chair and standing only) Dance for Parkinson 's classes will be on Tuesdays 9:30am-10:30am starting October 3-December 12 with a break the week of November 21st. Located in the [employer] , in the first floor of the [employer] ([place].) To register:  [email or web address] or [phone]  Drumming for Parkinson's will be held on 2nd and 4th Mondays at 11:00 am.   Located at the [place] ([place]  [place] at [email or web address] or [phone]  Through support from the [place] for Parkinson's classes are free for both patients and caregivers.    [employer] Parkinson's Tai Chi  Class, Mondays at 11 am.  Call [phone] for details Parkinson's Holiday Party.  Wednesday, December 6th, 4:00-5:00 pm.  [facility] and Fitness.  RSVP to Garnetta Buddy at [phone] or [email or web address]   [place]:  [email or web address]  PD Health at Home continues:  Mindfulness Mondays, Wellness Wednesdays, Fitness Fridays   Upcoming Education:   Why Should you Participate in Parkinson's Research?  Wednesday, Nov. 29th,  1-2 pm  Expert Briefing:    Hallucinations and Delusions in Parkinson's.  Wednesday, Nov. 8th, 1-2 pm  Register for expert briefings (webinars) at [email or web address]  Please check out their website to sign up for emails and see their full online offerings      [place]:  [email or web address]   Third Thursday Webinars:  On the third Thursday of every month at 12 p.m. ET, join our free live webinars to learn about various aspects of living with Parkinson's disease and our work to speed medical breakthroughs.  Upcoming Webinar:  A Year Like No Other in Parkinson's Research:  2023 in Review.  Thursday, November 16th 12 noon. Check out additional information on their website to see their full online offerings    [facility]:  [email or web address]  Upcoming Webinar:   Stay tuned  Webinar Series:  Living with Parkinson's Meetup.   Third Thursdays each month, 3 pm  Care Partner Monthly Meetup.  With Robin Searing Phinney.  First Tuesday of each month, 2 pm  Check out additional information to Live Well Today on their website    Parkinson and Movement Disorders (PMD) Alliance:  www.[email or web address]  [employer]:  Online Education Events  Sign up for emails, which are sent weekly to give  you updates on programming and online offerings    Parkinson's Association of the [place]:  www.[email or web address]   Information on online support groups, education events, and online exercises including Yoga, Parkinson's exercises and more-LOTS of information on links to PD resources and online events  Virtual Support Group through Parkinson's Association of the [place]; next one is scheduled for Wednesday, November 1st  at 2 pm.  (These are typically scheduled for the 1st Wednesday of the month at 2 pm).  Visit website for details.   MOVEMENT AND EXERCISE OPPORTUNITIES  PWR! Moves Classes at [place].  Wednesdays 10 and 11 am.   Contact Amy Marriott, PT [email or web address] if interested.  NEW PWR! Moves Class offerings at [employer].  *TUESDAYS* and Wednesdays 1-2 pm.  Contact Vonna Kotyk at  [email or web address]    Parkinson's Wellness Recovery (PWR! Moves)  www.[email or web address]  Info on the PWR! Virtual Experience:  You will have access to our expertise?through self-assessment, guided plans that start with the PD-specific fundamentals, educational content, tips, Q&A with an expert, and a growing Art therapist of PD-specific pre-recorded and live exercise classes of varying types and intensity - both physical and cognitive! If that is not enough, we offer 1:1 wellness consultations (in-person or virtual) to personalize your PWR! Research scientist (medical).   [place] Fridays:   As part of the PD Health @ Home program, this free video series focuses each week on one aspect of fitness designed to support people living with Parkinson's.? These weekly videos highlight the [place] fitness guidelines for people with Parkinson's disease.  [email or web address]   Dance for PD website is offering free, live-stream classes throughout the week, as well as links to [employer] of classes:  [email or web address]  Virtual dance and Pilates for Parkinson's classes: Click on the Community Tab> Parkinson's  Movement Initiative Tab.  To register for classes and for more information, visit [email or web address] and click the "community" tab.   [employer] Parkinson's Cycling Classes   [employer]:  Thursdays @ Noon-Live classes at [employer] ([employer] at [email or web address]?or [phone])  [facility] [employer]: Virtual Classes Mondays and Thursdays Jeanette Caprice classes Tuesday, Wednesday and Thursday (contact [place] at [email or web address] ?or [phone])  [place]  Varied levels of classes are offered Tuesdays and Thursdays at [employer].   Stretching with Verdis Frederickson weekly class is also offered for people with Parkinson's  To observe a class or for more information, call [phone] or email Hezzie Bump at [email or web address]   ADDITIONAL SUPPORT AND RESOURCES  [employer]:Online Caregiver Education Opportunities:  www.well-springsolutions.org/caregiver-education/caregiver-support-group.  You may also contact Vickki Muff at [email or web address] or [phone].     Well-Spring Navigator:  Just1Navigator program, a?free service to help individuals and families through the journey of determining care for older adults.  The "Navigator" is a Education officer, museum, Arnell Asal, who will speak with a prospective client and/or loved ones to provide an assessment of the situation and a set of recommendations for a personalized care plan -- all free of charge, and whether?Well[employer]Spring [employer] offers the needed service or not. If the need is not a service we provide, we are well-connected with reputable programs in town that we can refer you to.  www.well-springsolutions.org or to speak with the Navigator, call [phone].

## 2022-05-15 DIAGNOSIS — H5201 Hypermetropia, right eye: Secondary | ICD-10-CM | POA: Diagnosis not present

## 2022-05-20 ENCOUNTER — Other Ambulatory Visit: Payer: Self-pay | Admitting: Neurology

## 2022-07-04 ENCOUNTER — Ambulatory Visit: Payer: Medicare PPO | Attending: Neurology | Admitting: Occupational Therapy

## 2022-07-04 ENCOUNTER — Ambulatory Visit: Payer: Medicare PPO | Admitting: Physical Therapy

## 2022-07-04 DIAGNOSIS — R2689 Other abnormalities of gait and mobility: Secondary | ICD-10-CM | POA: Insufficient documentation

## 2022-07-04 DIAGNOSIS — R29818 Other symptoms and signs involving the nervous system: Secondary | ICD-10-CM | POA: Insufficient documentation

## 2022-07-04 NOTE — Therapy (Signed)
[facility] [address] [place], [place], [address] Phone: [phone]   Fax:  [phone]  Patient Details  Name: Jason Herrera MRN: [id number] Date of Birth: 03-23-1951 Referring Provider:  No ref. provider found  Encounter Date: 07/04/2022  Occupational Therapy Parkinson's Disease Screen  Hand dominance:  right   Physical Performance Test item #2 (simulated eating):  12.03 sec  Fastening/unfastening 3 buttons in:  22.88 sec  9-hole peg test:    RUE  30.5 sec        LUE  24.18 sec  Box & Blocks Test:   RUE  61 blocks        LUE  57 blocks  Other Comments:  Pt reports that he feels like he is "deteriorating as slowly as possible" and "does not feel like capabilities have noticeably changed".  Pt does not require occupational therapy services at this time.  Recommended occupational therapy screen in   6-9 months   [place], [place], [place] 07/04/2022, 11:30 AM  [facility] [address] [place], [place], [address] Phone: [phone]   Fax:  [phone]

## 2022-07-04 NOTE — Therapy (Signed)
[facility] [address], [place], [place], [address] Phone: [phone]   Fax:  [phone]  Patient Details  Name: Jason Herrera MRN: [id number] Date of Birth: [birth year] Referring Provider:  No ref. provider found  Encounter Date: 07/04/2022  Physical Therapy Parkinson's Disease Screen  Timed Up and Go test:12.22 sec Compared to 11.34 sec   10 meter walk test:  8.93 sec (3.67 ft/sec) Compared to 8 sec (4.1 ft/sec)   5 time sit to stand test:12.53 sec Compared to 12.78 sec   Patient does not require Physical Therapy services at this time.  Recommend Physical Therapy screen in 6-9 months   Does exercises regularly.  Reports occasional unsteadiness with changes in elevations (quick sit<>stand) and quick turns.  Feels that he does not notice significant changes and requests screen in 6-9 months.  Acknowledges understanding of reaching out to MD for PT referral if balance changes are significant prior to that point.  [name] W., PT 07/04/2022, 11:56 AM  [facility] [address] [place], [place], [address] Phone: [phone]   Fax:  [phone]

## 2022-07-11 DIAGNOSIS — Z125 Encounter for screening for malignant neoplasm of prostate: Secondary | ICD-10-CM | POA: Diagnosis not present

## 2022-07-11 DIAGNOSIS — E7800 Pure hypercholesterolemia, unspecified: Secondary | ICD-10-CM | POA: Diagnosis not present

## 2022-07-11 DIAGNOSIS — G20A1 Parkinson's disease without dyskinesia, without mention of fluctuations: Secondary | ICD-10-CM | POA: Diagnosis not present

## 2022-07-11 DIAGNOSIS — Z0000 Encounter for general adult medical examination without abnormal findings: Secondary | ICD-10-CM | POA: Diagnosis not present

## 2022-07-11 DIAGNOSIS — E669 Obesity, unspecified: Secondary | ICD-10-CM | POA: Diagnosis not present

## 2022-08-04 ENCOUNTER — Other Ambulatory Visit: Payer: Self-pay | Admitting: Neurology

## 2022-08-21 ENCOUNTER — Encounter: Payer: Self-pay | Admitting: Podiatry

## 2022-08-21 ENCOUNTER — Ambulatory Visit: Payer: Medicare PPO | Admitting: Podiatry

## 2022-08-21 DIAGNOSIS — B351 Tinea unguium: Secondary | ICD-10-CM

## 2022-08-21 DIAGNOSIS — L910 Hypertrophic scar: Secondary | ICD-10-CM | POA: Diagnosis not present

## 2022-08-21 DIAGNOSIS — M2042 Other hammer toe(s) (acquired), left foot: Secondary | ICD-10-CM | POA: Diagnosis not present

## 2022-08-21 DIAGNOSIS — L600 Ingrowing nail: Secondary | ICD-10-CM | POA: Diagnosis not present

## 2022-08-21 NOTE — Patient Instructions (Signed)

## 2022-08-22 NOTE — Progress Notes (Signed)
Subjective:   Patient ID: Jason Herrera, male   DOB: 72 y.o.   MRN: [id number]   HPI Patient presents with caregiver with chronic ingrown toenail left big toe that is been very tender and also concerns about nail disease and whether or not laser would be of benefit   Review of Systems  All other systems reviewed and are negative.       Objective:  Physical Exam Vitals and nursing note reviewed.  Constitutional:      Appearance: He is well-developed.  Pulmonary:     Effort: Pulmonary effort is normal.  Musculoskeletal:        General: Normal range of motion.  Skin:    General: Skin is warm.  Neurological:     Mental Status: He is alert.   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

## 2022-09-05 DIAGNOSIS — E7800 Pure hypercholesterolemia, unspecified: Secondary | ICD-10-CM | POA: Diagnosis not present

## 2022-09-05 DIAGNOSIS — Z79899 Other long term (current) drug therapy: Secondary | ICD-10-CM | POA: Diagnosis not present

## 2022-09-11 NOTE — Progress Notes (Signed)
    Assessment/Plan:   1.  Parkinsons Disease  -Continue carbidopa levodopa 25/100, 1 tablet 3 times per day.  -He knows Parkinsons Disease increases risk for melanoma.  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  Follows with Dr. Sharyn Lull  -questions answered re: research, Parkinsons Disease studies, probiotics, etc  -discussed PDGenerAtion study   Subjective:   Jason Herrera was seen today in follow up for Parkinsons disease. This patient is accompanied in the office by his spouse who supplements the history.   Patient is doing well from a Parkinson's standpoint.  Continues on levodopa, without side effects.  No falls.  Saw Dr. Sharyn Lull few weeks ago.  Doing drumming and Dance Parkinsons Disease programs.  He also goes to the game night.     Current prescribed movement disorder medications: Carbidopa/levodopa 25/100, 1 tablet 3 times per day (8am/noon/4-5pm)    ALLERGIES:  No Known Allergies  CURRENT MEDICATIONS:  Current Meds  Medication Sig   atorvastatin (LIPITOR) 20 MG tablet Oral for 60 Days   Calcium Carb-Cholecalciferol 500-2.5 MG-MCG CHEW Chew by mouth.   calcium carbonate (TUMS - DOSED IN MG ELEMENTAL CALCIUM) 500 MG chewable tablet Chew 1-2 tablets by mouth 3 (three) times daily as needed. For indigestion   carbidopa-levodopa (SINEMET IR) 25-100 MG tablet TAKE 1 TABLET THREE TIMES DAILY     Objective:   PHYSICAL EXAMINATION:    VITALS:   Vitals:   09/16/22 1413  BP: 138/72  Pulse: 68  SpO2: 98%  Weight: 274 lb 9.6 oz (124.6 kg)  Height: 5\' 10"  (1.778 m)    GEN:  The patient appears stated age and is in NAD. HEENT:  Normocephalic, atraumatic.  The mucous membranes are moist.   Neurological examination:  Orientation: The patient is alert and oriented x3. Cranial nerves: There is good facial symmetry with min facial hypomimia. The speech is fluent and clear. Soft palate rises symmetrically and there is no tongue deviation. Hearing is intact to  conversational tone. Sensation: Sensation is intact to light touch throughout Motor: Strength is at least antigravity x4.  Movement examination: Tone: There is nl tone in the bilateral upper extremities.  The tone in the lower extremities is nl.  Abnormal movements: no tremor today Coordination:  There is no decremation, with any form of RAMS, including alternating supination and pronation of the forearm, hand opening and closing, finger taps, heel taps.  There is decremation, mild, with toe taps on the L Gait and Station: The patient has no difficulty arising out of a deep-seated chair without the use of the hands. The patient's stride length is good with arm swing bilaterally.

## 2022-09-16 ENCOUNTER — Encounter: Payer: Self-pay | Admitting: Neurology

## 2022-09-16 ENCOUNTER — Ambulatory Visit: Payer: Medicare PPO | Admitting: Neurology

## 2022-09-16 VITALS — BP 138/72 | HR 68 | Ht 70.0 in | Wt 274.6 lb

## 2022-09-16 DIAGNOSIS — G20A1 Parkinson's disease without dyskinesia, without mention of fluctuations: Secondary | ICD-10-CM

## 2022-09-16 NOTE — Patient Instructions (Signed)
Local and Online Resources for Power over Parkinson's Group  April 2024   LOCAL [place] PARKINSON'S GROUPS   Power over Parkinson's Group:    Power Over Parkinson's Patient Education Group will be Wednesday, April 10th-*Hybrid meting*- in person at [place] [facility] location and via WEBEX, 2:00-3:00 pm.   Power over Parkinson's and Care Partner Groups will meet together, with plans for separate break out session for caregivers, depending on topic/speaker Upcoming Power over Parkinson's Meetings/Care Partner Support:  2nd Wednesdays of the month at 2 pm:   April 10th, May 8th Contact Amy Marriott at [email or web address] if interested in participating in this group    LOCAL EVENTS AND NEW OFFERINGS  NEW:  Parkinson's Social Game Night.  First Thursday of each month, 2:00-4:00 pm.  *Next date is April 4th*.  [facility], [place].  Contact [email or web address] if interested. Parkinson's CarePartner Group for Men is in the works, if interested email [email or web address] ACT FITNESS Chair Yoga classes "Train and Gain", Fridays 10 am, [employer].  Contact Gina at [phone].  Community Fitness Instructor-Led Parkinson's Exercises Classes offering at [employer]!  TUESDAYS (Chair Yoga)  and Wednesdays (PWR! Moves)  1:00 pm.   Contact Christy Weaver at  [email or web address]  or [phone]  Drumming for Parkinson's will be held on 2nd and 4th Mondays at 11:00 am.   Located at the [employer] ([address] [place]  Contact Jane Maydian at [email or web address] or [phone]  Dance for Parkinson 's classes will be on Tuesdays 10-11 am. Located in the [facility], in the first floor of the [facility] ([address] To register:  [email or web address] or [phone] [employer] Parkinson's Tai Chi Class, Mondays at 11 am.  Call [phone] for  details Hamil-[place]err Challenge.  Bike, Run, Walk Fundraiser for Parkinson's.  Saturday, April 6th at [place].  To register, visit [email or web address] Moving Day [place].  Saturday, May 4th, 10 am start.  Register at [email or web address]    ONLINE EDUCATION AND SUPPORT  Parkinson Foundation:  www.parkinson.org  PD Health at Home continues:  Mindfulness Mondays, Wellness Wednesdays, Fitness Fridays  (PWR! Moves as part of Fitness Fridays March 22nd, 1-1:45 pm) Upcoming Education:   Parkinson's 101.  Wednesday, April 3rd, 1-2 pm Movement for Parkinson's.  Wednesday, May 1st, 1-2 pm Expert Briefing:  Research Update:  Working to Halt PD.  Wednesday, April 10th, 1-2 pm Trouble with Zzz's:  Sleep Challenges with Parkinson's.  Wed, May 8th 1-2 pm Register for virtual education and expert briefings (webinars) at www.parkinson.org/resources-support/online-education Please check out their website to sign up for emails and see their full online offerings     Michael J Fox Foundation:  www.michaeljfox.org   Third Thursday Webinars:  On the third Thursday of every month at 12 p.m. ET, join our free live webinars to learn about various aspects of living with Parkinson's disease and our work to speed medical breakthroughs.  Upcoming Webinar:  Let's Talk Taboos:  Hard-to-Discuss Parkinson's Symptoms.  Thursday, April 18th at 12 noon. Check out additional information on their website to see their full online offerings    Davis Phinney Foundation:  www.davisphinneyfoundation.org  Upcoming Webinar:   Emergent Therapies.  Wednesday, April 2nd, 4 pm Series:  Living with Parkinson's Meetup.   Third Thursdays each month, 3 pm  Care Partner Monthly Meetup.  With Connie Carpenter Phinney.  First Tuesday of each month, 2 pm  Check out additional   information to Live Well Today on their website    Parkinson and Movement Disorders (PMD) Alliance:  www.pmdalliance.org  [employer]  Online:  Online Education Events  Sign up for emails, which are sent weekly to give you updates on programming and online offerings    Parkinson's Association of [place]:  www.parkinsonassociation.org  Information on online support groups, education events, and online exercises including Yoga, Parkinson's exercises and more-LOTS of information on links to PD resources and online events  Virtual Support Group through Parkinson's Association of [place]; next one is scheduled for Wednesday, April 3rd  MOVEMENT AND EXERCISE OPPORTUNITIES  PWR! Moves Classes at [place] [facility].  Wednesdays 10 and 11 am.   Contact Amy Marriott, PT [email or web address] if interested.  Parkinson's Exercise Class offerings at [employer]. *TUESDAYS* (Chair yoga) and Wednesdays (PWR! Moves)  1:00 pm.    Contact Christy Weaver at [email or web address]    Parkinson's Wellness Recovery (PWR! Moves)  www.pwr4life.org  Info on the PWR! Virtual Experience:  You will have access to our expertise?through self-assessment, guided plans that start with the PD-specific fundamentals, educational content, tips, Q&A with an expert, and a growing library of PD-specific pre-recorded and live exercise classes of varying types and intensity - both physical and cognitive! If that is not enough, we offer 1:1 wellness consultations (in-person or virtual) to personalize your PWR! Virtual Experience.   Parkinson Foundation Fitness Fridays:   As part of the PD Health @ Home program, this free video series focuses each week on one aspect of fitness designed to support people living with Parkinson's.? These weekly videos highlight the Parkinson Foundation fitness guidelines for people with Parkinson's disease.  www.parkinson.org/resources-support/online-education/pdhealth#ff  Dance for PD website is offering free, live-stream classes throughout the week, as well as links to digital library of classes:   https://danceforparkinsons.org/  Virtual dance and Pilates for Parkinson's classes: Click on the Community Tab> Parkinson's Movement Initiative Tab.  To register for classes and for more information, visit www.americandancefestival.org and click the "community" tab.   [employer] Parkinson's Cycling Classes   [employer]:  Thursdays @ Noon-Live classes at [employer] (Contact Margaret Hazen at [email or web address]?or [phone])  [facility]: Classes Tuesday, Wednesday and Thursday (contact Marlee at [email or web address] ?or [phone])  [place] [facility] Boxing  Varied levels of classes are offered Tuesdays and Thursdays at [employer] [facility] [facility].   Stretching with Maria weekly class is also offered for people with Parkinson's  To observe a class or for more information, call [phone] or email Hillary Savage at [email or web address]   ADDITIONAL SUPPORT AND RESOURCES  [employer]:  Online Caregiver Education Opportunities:  www.well-springsolutions.org/caregiver-education/caregiver-support-group.  You may also contact Jodi Kolada at [email or web address] or [phone].     [employer] April Offerings National HealthCare Decisions Day:  The Most Critical Legal and Medical Decisions to Consider Now!  Tuesday, April 16th, 1-3 pm at [facility] at [address].  Contact Jodi Kolada at [email or web address] or [phone] Powerful Tools for Caregivers.  6 week educational series for caregivers.  April 18-May 23, 10:30 am-12:15 pm at [facility] 3rd Floor Conference Room.   Contact Jodi Kolada at [email or web address] or [phone] to register [facility] Navigator:  Just1Navigator program, a?free service to help individuals and families through the journey of determining care for older adults.  The "Navigator" is a social worker, Nicole Reynolds, who will speak with a prospective client and/or loved  ones to provide an assessment of the situation and a set of recommendations for a personalized care   plan -- all free of charge, and whether?[employer] offers the needed service or not. If the need is not a service we provide, we are well-connected with reputable programs in town that we can refer you to.  www.well-springsolutions.org or to speak with the Navigator, call [phone].     

## 2022-10-17 ENCOUNTER — Other Ambulatory Visit: Payer: Self-pay | Admitting: Neurology

## 2022-10-17 DIAGNOSIS — G20A1 Parkinson's disease without dyskinesia, without mention of fluctuations: Secondary | ICD-10-CM

## 2022-11-30 IMAGING — CR DG RIBS W/ CHEST 3+V*L*
3 series · 3 of 3 positions shown · non-contrast
Comparison: None.

CLINICAL DATA: Left rib pain after lifting injury.

EXAM:
LEFT RIBS AND CHEST - 3+ VIEW

[w chest pa]
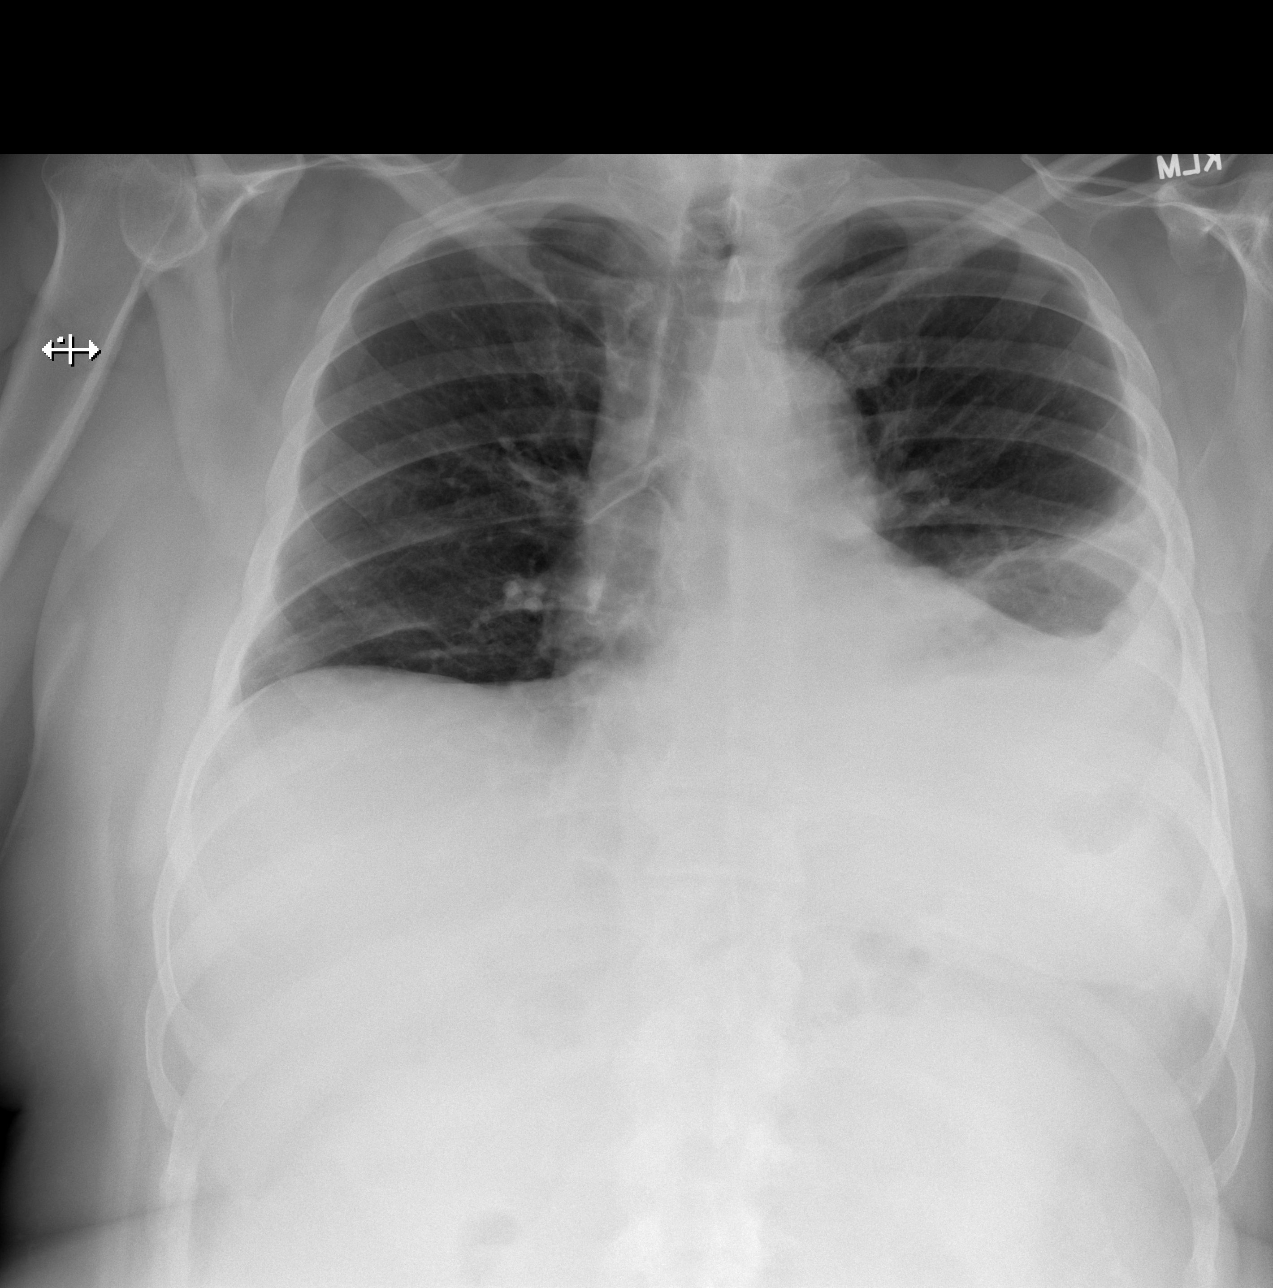

[w ribs ap lower left]
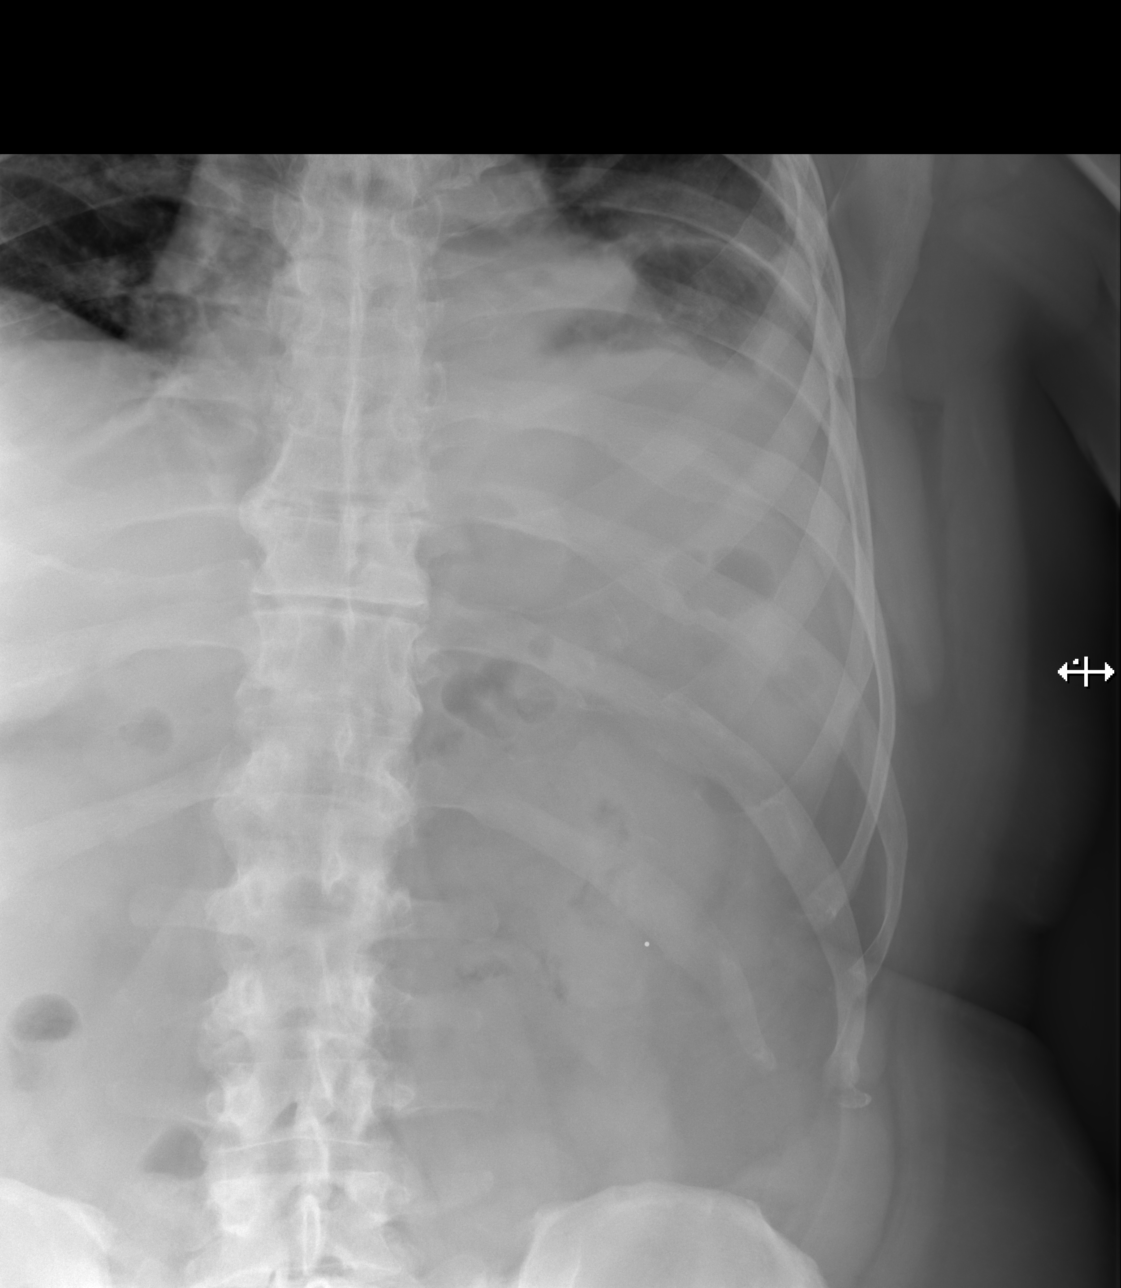

[w ribs obl left]
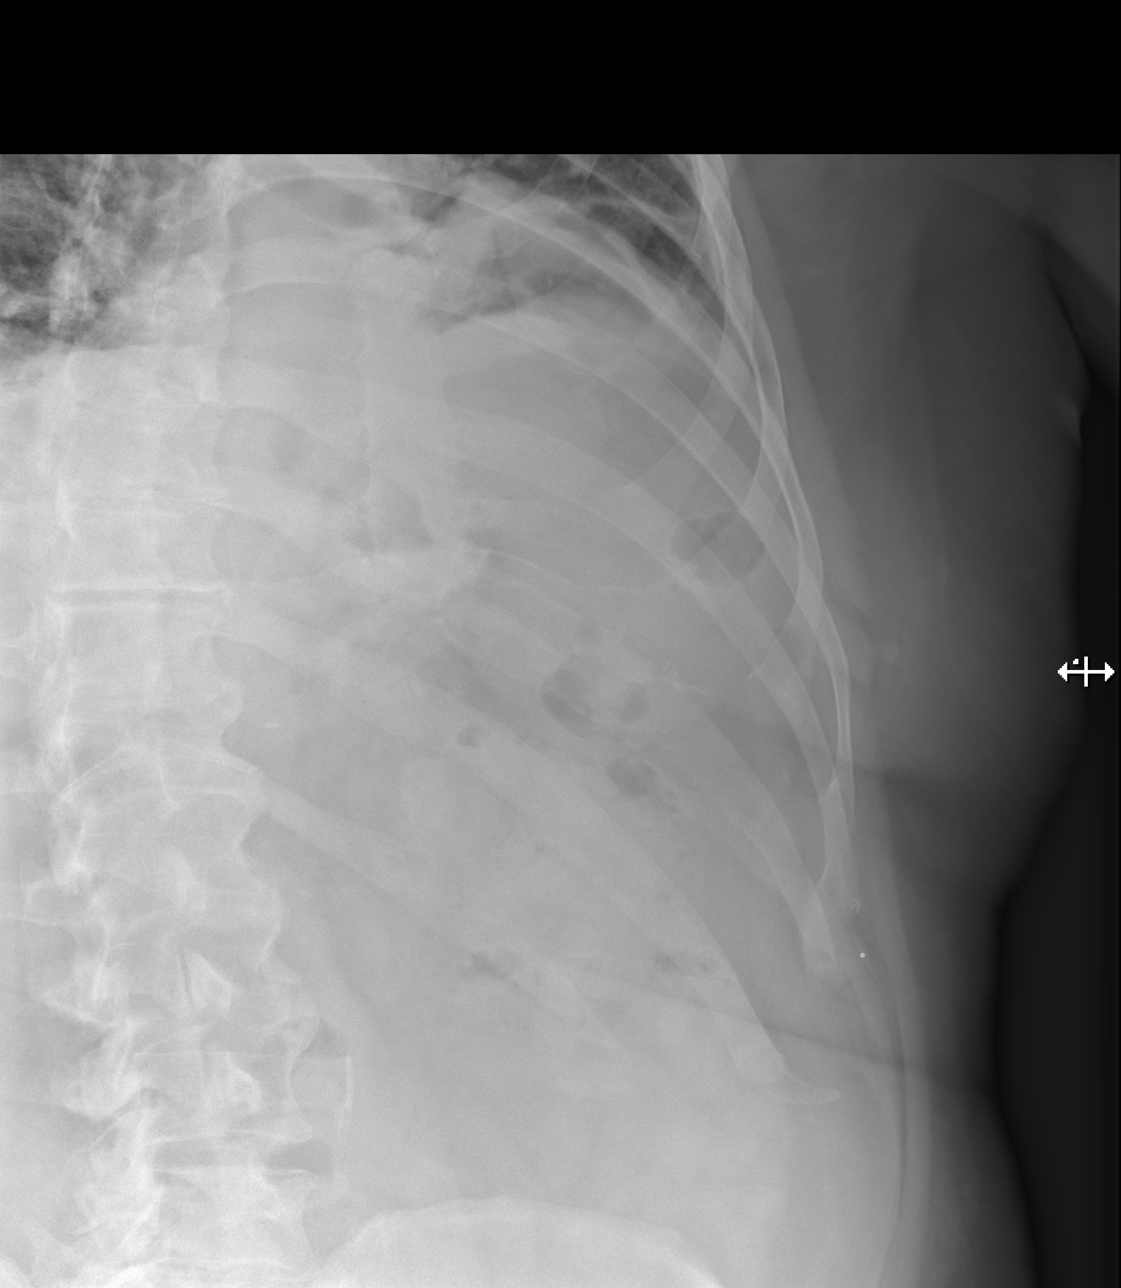

[3 of 3 positions shown; findings below may reference images not displayed]

FINDINGS: No fracture or other bone lesions are seen involving the ribs. No
pneumothorax is noted. Small left pleural effusion is noted with
associated left basilar atelectasis or infiltrate. Right lung is
clear. Heart size and mediastinal contours are within normal limits.
IMPRESSION: Normal left ribs. Small left pleural effusion with associated left
basilar atelectasis or infiltrate.

## 2022-12-29 ENCOUNTER — Other Ambulatory Visit: Payer: Self-pay | Admitting: Neurology

## 2022-12-29 DIAGNOSIS — G20A1 Parkinson's disease without dyskinesia, without mention of fluctuations: Secondary | ICD-10-CM

## 2023-03-14 ENCOUNTER — Other Ambulatory Visit: Payer: Self-pay | Admitting: Neurology

## 2023-03-14 DIAGNOSIS — G20A1 Parkinson's disease without dyskinesia, without mention of fluctuations: Secondary | ICD-10-CM

## 2023-03-19 NOTE — Progress Notes (Unsigned)
    Assessment/Plan:   1.  Parkinsons Disease  -Continue carbidopa levodopa 25/100, 1 tablet 3 times per day.  -He knows Parkinsons Disease increases risk for melanoma.  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  Follows with Dr. Sharyn Lull    Subjective:   Jason Herrera was seen today in follow up for Parkinsons disease. This patient is accompanied in the office by his spouse who supplements the history.   Patient continues to do well with his Parkinson's.  He has had no falls.  No lightheadedness or near syncope.   Current prescribed movement disorder medications: Carbidopa/levodopa 25/100, 1 tablet 3 times per day (8am/noon/4-5pm)    ALLERGIES:  No Known Allergies  CURRENT MEDICATIONS:  No outpatient medications have been marked as taking for the 03/20/23 encounter (Appointment) with Korvin Valentine, Octaviano Batty, DO.     Objective:   PHYSICAL EXAMINATION:    VITALS:   There were no vitals filed for this visit.   GEN:  The patient appears stated age and is in NAD. HEENT:  Normocephalic, atraumatic.  The mucous membranes are moist.   Neurological examination:  Orientation: The patient is alert and oriented x3. Cranial nerves: There is good facial symmetry with min facial hypomimia. The speech is fluent and clear. Soft palate rises symmetrically and there is no tongue deviation. Hearing is intact to conversational tone. Sensation: Sensation is intact to light touch throughout Motor: Strength is at least antigravity x4.  Movement examination: Tone: There is nl tone in the bilateral upper extremities.  The tone in the lower extremities is nl.  Abnormal movements: no tremor today Coordination:  There is no decremation, with any form of RAMS, including alternating supination and pronation of the forearm, hand opening and closing, finger taps, heel taps.  There is decremation, mild, with toe taps on the L Gait and Station: The patient has no difficulty arising out of a deep-seated  chair without the use of the hands. The patient's stride length is good with arm swing bilaterally.  Total time spent on today's visit was *** minutes, including both face-to-face time and nonface-to-face time.  Time included that spent on review of records (prior notes available to me/labs/imaging if pertinent), discussing treatment and goals, answering patient's questions and coordinating care.

## 2023-03-20 ENCOUNTER — Ambulatory Visit: Payer: Medicare PPO | Admitting: Physical Therapy

## 2023-03-20 ENCOUNTER — Ambulatory Visit: Payer: Medicare PPO | Admitting: Neurology

## 2023-03-20 ENCOUNTER — Ambulatory Visit: Payer: Medicare PPO | Attending: Neurology | Admitting: Occupational Therapy

## 2023-03-20 ENCOUNTER — Encounter: Payer: Self-pay | Admitting: Neurology

## 2023-03-20 VITALS — BP 124/80 | HR 61 | Wt 268.8 lb

## 2023-03-20 DIAGNOSIS — R2689 Other abnormalities of gait and mobility: Secondary | ICD-10-CM | POA: Insufficient documentation

## 2023-03-20 DIAGNOSIS — G20A1 Parkinson's disease without dyskinesia, without mention of fluctuations: Secondary | ICD-10-CM

## 2023-03-20 DIAGNOSIS — R29818 Other symptoms and signs involving the nervous system: Secondary | ICD-10-CM | POA: Insufficient documentation

## 2023-03-20 NOTE — Therapy (Signed)
[place] [facility] [address] [place], [place], [address] Phone: [phone]   Fax:  [phone]  Patient Details  Name: TORRIANO SESSIONS MRN: [id number] Date of Birth: 03-27-1951 Referring Provider:  No ref. provider found  Encounter Date: 03/20/2023  Occupational Therapy Parkinson's Disease Screen  Hand dominance:  right   Physical Performance Test item #2 (simulated eating):  11.66 sec  Fastening/unfastening 3 buttons in:  28.69 sec (first button requiring increased time)  9-hole peg test:    RUE  24.79 sec        LUE  24.71 sec  Box & Blocks Test:   RUE  63 blocks        LUE  61 blocks  Other Comments:  Pt reports noticing increased tremors if he doesn't take his PD meds on schedule, but otherwise has no complaints or concerns.  Pt does not require occupational therapy services at this time.  Recommended occupational therapy screen in   6-8 months.   Rosalio Loud, OT 03/20/2023, 11:46 AM  [place] [facility] [address] [place], [place], [address] Phone: [phone]   Fax:  [phone]

## 2023-03-20 NOTE — Therapy (Signed)
[place] [facility] [address] [place], [place], [address] Phone: [phone]   Fax:  [phone]  Patient Details  Name: Jason Herrera MRN: [id number] Date of Birth: 04-06-1951 Referring Provider:  No ref. provider found  Encounter Date: 03/20/2023  Physical Therapy Parkinson's Disease Screen   Timed Up and Go test: 9.53 sec (compared to 12.22 sec)  10 meter walk test: 8.34 sec = 3.93 ft/sec(compared to 3.67 ft/sec)  5 time sit to stand test:12 sec (compared to 12.53 sec)   Patient does not require Physical Therapy services at this time.  Recommend Physical Therapy screen in 6-9 months.    Gilverto Dileonardo W., PT 03/20/2023, 11:06 AM  [place]  [facility] [address] [place], [place], [address] Phone: [phone]   Fax:  [phone]

## 2023-04-08 DIAGNOSIS — G20A1 Parkinson's disease without dyskinesia, without mention of fluctuations: Secondary | ICD-10-CM | POA: Diagnosis not present

## 2023-04-08 DIAGNOSIS — Z85828 Personal history of other malignant neoplasm of skin: Secondary | ICD-10-CM | POA: Diagnosis not present

## 2023-04-08 DIAGNOSIS — Z809 Family history of malignant neoplasm, unspecified: Secondary | ICD-10-CM | POA: Diagnosis not present

## 2023-04-08 DIAGNOSIS — E785 Hyperlipidemia, unspecified: Secondary | ICD-10-CM | POA: Diagnosis not present

## 2023-04-08 DIAGNOSIS — I10 Essential (primary) hypertension: Secondary | ICD-10-CM | POA: Diagnosis not present

## 2023-04-08 DIAGNOSIS — Z6837 Body mass index (BMI) 37.0-37.9, adult: Secondary | ICD-10-CM | POA: Diagnosis not present

## 2023-04-08 DIAGNOSIS — M2040 Other hammer toe(s) (acquired), unspecified foot: Secondary | ICD-10-CM | POA: Diagnosis not present

## 2023-05-19 DIAGNOSIS — H5203 Hypermetropia, bilateral: Secondary | ICD-10-CM | POA: Diagnosis not present

## 2023-05-19 DIAGNOSIS — H2513 Age-related nuclear cataract, bilateral: Secondary | ICD-10-CM | POA: Diagnosis not present

## 2023-05-23 DIAGNOSIS — B349 Viral infection, unspecified: Secondary | ICD-10-CM | POA: Diagnosis not present

## 2023-05-23 DIAGNOSIS — Z20822 Contact with and (suspected) exposure to covid-19: Secondary | ICD-10-CM | POA: Diagnosis not present

## 2023-05-23 DIAGNOSIS — G20A1 Parkinson's disease without dyskinesia, without mention of fluctuations: Secondary | ICD-10-CM | POA: Diagnosis not present

## 2023-05-23 DIAGNOSIS — R509 Fever, unspecified: Secondary | ICD-10-CM | POA: Diagnosis not present

## 2023-05-23 DIAGNOSIS — Z79899 Other long term (current) drug therapy: Secondary | ICD-10-CM | POA: Diagnosis not present

## 2023-05-26 ENCOUNTER — Other Ambulatory Visit: Payer: Self-pay | Admitting: Neurology

## 2023-05-26 DIAGNOSIS — G20A1 Parkinson's disease without dyskinesia, without mention of fluctuations: Secondary | ICD-10-CM

## 2023-07-17 DIAGNOSIS — Z0000 Encounter for general adult medical examination without abnormal findings: Secondary | ICD-10-CM | POA: Diagnosis not present

## 2023-07-17 DIAGNOSIS — R7301 Impaired fasting glucose: Secondary | ICD-10-CM | POA: Diagnosis not present

## 2023-07-17 DIAGNOSIS — G20A1 Parkinson's disease without dyskinesia, without mention of fluctuations: Secondary | ICD-10-CM | POA: Diagnosis not present

## 2023-07-17 DIAGNOSIS — E669 Obesity, unspecified: Secondary | ICD-10-CM | POA: Diagnosis not present

## 2023-07-17 DIAGNOSIS — E7800 Pure hypercholesterolemia, unspecified: Secondary | ICD-10-CM | POA: Diagnosis not present

## 2023-07-17 DIAGNOSIS — Z125 Encounter for screening for malignant neoplasm of prostate: Secondary | ICD-10-CM | POA: Diagnosis not present

## 2023-08-09 ENCOUNTER — Other Ambulatory Visit: Payer: Self-pay | Admitting: Neurology

## 2023-08-09 DIAGNOSIS — G20A1 Parkinson's disease without dyskinesia, without mention of fluctuations: Secondary | ICD-10-CM

## 2023-08-13 ENCOUNTER — Telehealth: Payer: Self-pay | Admitting: Neurology

## 2023-08-13 NOTE — Telephone Encounter (Signed)
 Called patient and give information

## 2023-08-13 NOTE — Telephone Encounter (Signed)
 Left message with the after hour service on 08-13-23 at 12:33 pm  Caller states that they needs to know if there is a Parkinson support group for today

## 2023-09-01 NOTE — Progress Notes (Unsigned)
    Assessment/Plan:   1.  Parkinsons Disease  -Continue carbidopa  levodopa  25/100, 1 tablet 3 times per day.  -He knows Parkinsons Disease increases risk for melanoma.  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  Follows with Dr. Dorisann Herrera  -discussed importance of exercise    Subjective:   Jason Herrera was seen today in follow up for Parkinsons disease. This patient is accompanied in the office by his spouse who supplements the history.   Pt denies falls.  Pt denies lightheadedness, near syncope.  No hallucinations.  Mood has been good.    Current prescribed movement disorder medications: Carbidopa /levodopa  25/100, 1 tablet 3 times per day (8am/noon/4-5pm)    ALLERGIES:  No Known Allergies  CURRENT MEDICATIONS:  No outpatient medications have been marked as taking for the 09/02/23 encounter (Appointment) with Adi Seales, [place], DO.     Objective:   PHYSICAL EXAMINATION:    VITALS:   There were no vitals filed for this visit.    GEN:  The patient appears stated age and is in NAD. HEENT:  Normocephalic, atraumatic.  The mucous membranes are moist.   Neurological examination:  Orientation: The patient is alert and oriented x3. Cranial nerves: There is good facial symmetry with min facial hypomimia. The speech is fluent and clear. Soft palate rises symmetrically and there is no tongue deviation. Hearing is intact to conversational tone. Sensation: Sensation is intact to light touch throughout Motor: Strength is at least antigravity x4.  Movement examination: Tone: There is nl tone in the bilateral upper extremities.  The tone in the lower extremities is nl.  Abnormal movements: no tremor today Coordination:  There is no decremation, with any form of RAMS, including alternating supination and pronation of the forearm, hand opening and closing, finger taps, heel taps.  There is decremation, mild, with toe taps on the L Gait and Station: The patient has no difficulty  arising out of a deep-seated chair without the use of the hands. The patient's stride length is good with arm swing bilaterally.  Total time spent on today's visit was *** minutes, including both face-to-face time and nonface-to-face time.  Time included that spent on review of records (prior notes available to me/labs/imaging if pertinent), discussing treatment and goals, answering patient's questions and coordinating care.

## 2023-09-02 ENCOUNTER — Ambulatory Visit: Payer: Medicare PPO | Admitting: Neurology

## 2023-09-02 ENCOUNTER — Encounter: Payer: Self-pay | Admitting: Neurology

## 2023-09-02 VITALS — BP 110/70 | HR 79

## 2023-09-02 DIAGNOSIS — G20A1 Parkinson's disease without dyskinesia, without mention of fluctuations: Secondary | ICD-10-CM

## 2023-09-02 NOTE — Patient Instructions (Signed)
 SAVE THE DATE!  We have a Parkinsons Disease symposium on sept 35 at [employer].  More to come!

## 2023-09-10 NOTE — Therapy (Signed)
 Physical Therapy Parkinson's Disease Screen   Patient reports noticing more return of sx when skipping his PD medication. Some imbalance when turning corners. Denies falls. He reports that his wife has stopped complaining about his shuffling as he has been paying attention to it more. Attending a weekly dance class and monthly exercise class, also staying busy with yard work.    Timed Up and Go test:9.75 sec (compared to 9.53 sec on 03/20/23)  10 meter walk test:9.61 sec, 3.4 ft/sec  (compared to 3.93 ft/sec on 03/20/23)  5 time sit to stand test:11.28 sec without UE support (compared to 12 sec on 03/20/23)   Patient does not require Physical Therapy services at this time.  Recommend Physical Therapy screen in 6 months.

## 2023-09-11 ENCOUNTER — Ambulatory Visit: Payer: Medicare PPO | Admitting: Occupational Therapy

## 2023-09-11 ENCOUNTER — Ambulatory Visit: Payer: Medicare PPO | Attending: Neurology | Admitting: Physical Therapy

## 2023-09-11 DIAGNOSIS — R2689 Other abnormalities of gait and mobility: Secondary | ICD-10-CM | POA: Insufficient documentation

## 2023-09-11 DIAGNOSIS — R29818 Other symptoms and signs involving the nervous system: Secondary | ICD-10-CM | POA: Insufficient documentation

## 2023-09-11 NOTE — Therapy (Signed)
 [place] [facility] [address] [place], [place], [address] Phone: [phone]   Fax:  [phone]  Patient Details  Name: LEIGHTON RODNEY MRN: [id number] Date of Birth: [birth year] Referring Provider:  No ref. provider found  Encounter Date: 09/11/2023  Occupational Therapy Parkinson's Disease Screen  Hand dominance:  right   Physical Performance Test item #2 (simulated eating):  9.28 sec  Fastening/unfastening 3 buttons in:  23.81 sec  9-hole peg test:    RUE  22.66 sec        LUE  22.84 sec   Other Comments:  "Things are about as I would expect."  Pt reports noticing increased difficulty with mobility if/when late on PD meds, reporting that it used to take 1-1.5 hours before recognizing change and now only about 30 mins.  Pt still going to PD drumming and dance classes.    Pt does not require occupational therapy services at this time.  Recommended occupational therapy screen in   6-8 months.    Anthonette Kinsman, OT 09/11/2023, 11:04 AM  [place] [facility] [address] [place], [place], [address] Phone: [phone]   Fax:  [phone]

## 2023-10-23 ENCOUNTER — Other Ambulatory Visit: Payer: Self-pay | Admitting: Neurology

## 2023-10-23 DIAGNOSIS — G20A1 Parkinson's disease without dyskinesia, without mention of fluctuations: Secondary | ICD-10-CM

## 2023-12-26 ENCOUNTER — Ambulatory Visit (HOSPITAL_COMMUNITY)
Admission: RE | Admit: 2023-12-26 | Discharge: 2023-12-26 | Disposition: A | Discharge status: Home / Self Care | Source: Ambulatory Visit | Attending: Family Medicine | Admitting: Family Medicine

## 2023-12-26 ENCOUNTER — Other Ambulatory Visit (HOSPITAL_COMMUNITY): Payer: Self-pay | Admitting: Family Medicine

## 2023-12-26 DIAGNOSIS — R1030 Lower abdominal pain, unspecified: Secondary | ICD-10-CM

## 2023-12-26 DIAGNOSIS — R630 Anorexia: Secondary | ICD-10-CM | POA: Diagnosis not present

## 2023-12-26 DIAGNOSIS — R1010 Upper abdominal pain, unspecified: Secondary | ICD-10-CM

## 2023-12-26 DIAGNOSIS — K449 Diaphragmatic hernia without obstruction or gangrene: Secondary | ICD-10-CM | POA: Diagnosis not present

## 2023-12-26 DIAGNOSIS — N400 Enlarged prostate without lower urinary tract symptoms: Secondary | ICD-10-CM | POA: Diagnosis not present

## 2023-12-26 DIAGNOSIS — K4090 Unilateral inguinal hernia, without obstruction or gangrene, not specified as recurrent: Secondary | ICD-10-CM | POA: Diagnosis not present

## 2023-12-26 DIAGNOSIS — R141 Gas pain: Secondary | ICD-10-CM

## 2023-12-26 DIAGNOSIS — Z8719 Personal history of other diseases of the digestive system: Secondary | ICD-10-CM | POA: Diagnosis not present

## 2023-12-26 MED ORDER — IOHEXOL 300 MG/ML  SOLN
100.0000 mL | Freq: Once | INTRAMUSCULAR | Status: AC | PRN
  Administered 2023-12-26: 100 mL via INTRAVENOUS

## 2024-01-02 ENCOUNTER — Ambulatory Visit: Payer: Self-pay | Admitting: General Surgery

## 2024-01-02 DIAGNOSIS — K4090 Unilateral inguinal hernia, without obstruction or gangrene, not specified as recurrent: Secondary | ICD-10-CM | POA: Diagnosis not present

## 2024-01-02 DIAGNOSIS — D1724 Benign lipomatous neoplasm of skin and subcutaneous tissue of left leg: Secondary | ICD-10-CM | POA: Diagnosis not present

## 2024-01-02 DIAGNOSIS — K449 Diaphragmatic hernia without obstruction or gangrene: Secondary | ICD-10-CM | POA: Diagnosis not present

## 2024-01-05 ENCOUNTER — Other Ambulatory Visit: Payer: Self-pay | Admitting: Neurology

## 2024-01-05 DIAGNOSIS — G20A1 Parkinson's disease without dyskinesia, without mention of fluctuations: Secondary | ICD-10-CM

## 2024-03-17 NOTE — Progress Notes (Signed)
    Assessment/Plan:   1.  Parkinsons Disease  -Continue carbidopa  levodopa  25/100, 1 tablet 3 times per day.  -He knows Parkinsons Disease increases risk for melanoma.  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  Follows with Dr. Tricia yearly  2.  R inguinal hernia  -considering having surgery beginning 2026  -discussed:    -take your parkinsons medication the AM of surgery  -if medication for nausea is needed after surgery, I recommend zofran  instead of phenergan as phenergan can make parkinsons worse   Subjective:   Jason Herrera was seen today in follow up for Parkinsons disease.  Pt with wife who supplements hx.   Taking his carbidopa /levodopa  faithfully.  Attending Parkinsons Disease classes and Dance class.  No hallucinations but does have some active dreams.    No falls or near syncope.  Saw sx for a R inguinal hernia on 01/02/24.  Reports today that he plans to have it repaired at beginning of year   Current prescribed movement disorder medications: Carbidopa /levodopa  25/100, 1 tablet 3 times per day (8am/noon/4-5pm)    ALLERGIES:  No Known Allergies  CURRENT MEDICATIONS:  Current Meds  Medication Sig   atorvastatin (LIPITOR) 20 MG tablet Oral for 60 Days   Calcium Carb-Cholecalciferol 500-2.5 MG-MCG CHEW Chew by mouth.   calcium carbonate (TUMS - DOSED IN MG ELEMENTAL CALCIUM) 500 MG chewable tablet Chew 1-2 tablets by mouth 3 (three) times daily as needed. For indigestion   carbidopa -levodopa  (SINEMET  IR) 25-100 MG tablet TAKE 1 TABLET THREE TIMES DAILY     Objective:   PHYSICAL EXAMINATION:    VITALS:   Vitals:   03/18/24 1450  BP: 118/76  Pulse: 74  SpO2: 98%  Weight: 260 lb 12.8 oz (118.3 kg)     GEN:  The patient appears stated age and is in NAD. HEENT:  Normocephalic, atraumatic.  The mucous membranes are moist.  CV:  RRR Lungs:  CTAB Neck:  no bruits  Neurological examination:  Orientation: The patient is alert and oriented  x3. Cranial nerves: There is good facial symmetry with min facial hypomimia. The speech is fluent and clear. Soft palate rises symmetrically and there is no tongue deviation. Hearing is intact to conversational tone. Sensation: Sensation is intact to light touch throughout Motor: Strength is at least antigravity x4.  Movement examination: Tone: There is nl tone in the bilateral upper extremities.  The tone in the lower extremities is nl.  Abnormal movements: mild and intermittent LUE/LLE rest tremor Coordination:  There is mild decremation with finger and toe taps on the left.  All other RAMs are good bilaterally Gait and Station: The patient has no difficulty arising out of a deep-seated chair without the use of the hands. The patient's stride length is good with mild decreased arm swing on the L

## 2024-03-18 ENCOUNTER — Ambulatory Visit: Admitting: Neurology

## 2024-03-18 VITALS — BP 118/76 | HR 74 | Wt 260.8 lb

## 2024-03-18 DIAGNOSIS — K4090 Unilateral inguinal hernia, without obstruction or gangrene, not specified as recurrent: Secondary | ICD-10-CM | POA: Diagnosis not present

## 2024-03-18 DIAGNOSIS — G20A1 Parkinson's disease without dyskinesia, without mention of fluctuations: Secondary | ICD-10-CM

## 2024-03-18 NOTE — Patient Instructions (Addendum)
Regarding upcoming surgery, I recommend:  -take your parkinsons medication the AM of surgery  -if medication for nausea is needed after surgery, I recommend zofran instead of phenergan as phenergan can make parkinsons worse  

## 2024-03-21 ENCOUNTER — Other Ambulatory Visit: Payer: Self-pay | Admitting: Neurology

## 2024-03-21 DIAGNOSIS — G20A1 Parkinson's disease without dyskinesia, without mention of fluctuations: Secondary | ICD-10-CM

## 2024-04-15 ENCOUNTER — Ambulatory Visit: Admitting: Physical Therapy

## 2024-04-15 ENCOUNTER — Ambulatory Visit

## 2024-04-15 ENCOUNTER — Ambulatory Visit: Admitting: Occupational Therapy

## 2024-04-15 DIAGNOSIS — R29818 Other symptoms and signs involving the nervous system: Secondary | ICD-10-CM | POA: Insufficient documentation

## 2024-04-15 DIAGNOSIS — R471 Dysarthria and anarthria: Secondary | ICD-10-CM

## 2024-04-15 NOTE — Therapy (Signed)
 [place] [facility] [address] [place], [place], [address] Phone: [phone]   Fax:  [phone]  Patient Details  Name: Jason Herrera MRN: [id number] Date of Birth: [birth year] Referring Provider:  Evonnie Stabs, DO  Encounter Date: 04/15/2024  Speech Therapy Parkinson's Disease Screen   Decibel Level today: 71dB  (WNL=70-72 dB) with sound level meter 30cm away from pt's mouth. Pt's conversational volume is WNL today.   Pt does not report difficulty with swallowing, which does not warrant further evaluation  Pt does not endorse changes in cognition.   Pt does does not require speech therapy services at this time. Recommend ST screen in another 6-8 months   Franceen Erisman, CCC-SLP 04/15/2024, 12:25 PM  [place]  [facility] [address] [place], [place], [address] Phone: [phone]   Fax:  [phone]

## 2024-04-15 NOTE — Therapy (Signed)
 [place] [facility] [address] [place], [place], [address] Phone: [phone]   Fax:  [phone]  Patient Details  Name: Jason Herrera MRN: [id number] Date of Birth: [birth year] Referring Provider:  Evonnie Asberry RAMAN, DO  Encounter Date: 04/15/2024  Occupational Therapy Parkinson's Disease Screen  Hand dominance:  right   Physical Performance Test item #2 (simulated eating):  8.16 sec  Fastening/unfastening 3 buttons in:  21.65 sec  9-hole peg test:    RUE  24.47 sec        LUE  25.19 sec  Other Comments:  Pt reports from a PD standpoint things are about the same.  Reporting that if he thinks about his movement his tremors with decrease.  Pt does not require occupational therapy services at this time.  Recommended occupational therapy screen in 6-8 months   [place], [place], OT 04/15/2024, 11:48 AM  [place] [facility] [address] [place], [place], [address] Phone: [phone]   Fax:  [phone]

## 2024-04-15 NOTE — Therapy (Signed)
 [place] [facility] [address], [address] [place], [place], [address] Phone: [phone]   Fax:  [phone]  Patient Details  Name: Jason Herrera MRN: [id number] Date of Birth: [birth year] Referring Provider:  Evonnie Asberry RAMAN, DO  Encounter Date: 04/15/2024   Physical Therapy Parkinson's Disease Screen   Timed Up and Go test:10.57 sec  10 meter walk test: 8.93 sec  (3.67 ft/sec)  5 time sit to stand test:12.68 sec   Comparisons from previous screens 09/11/2023:  Timed Up and Go test:9.75 sec (compared to 9.53 sec on 03/20/23)   10 meter walk test:9.61 sec, 3.4 ft/sec  (compared to 3.93 ft/sec on 03/20/23)   5 time sit to stand test:11.28 sec without UE support (compared to 12 sec on 03/20/23)  Patient does not require Physical Therapy services at this time.  Recommend Physical Therapy screen in 6-9 months.    Lynise Porr W., PT 04/15/2024, 11:47 AM  [place] [facility] [address], [address] [place], [place], [address] Phone: [phone]   Fax:  [phone]

## 2024-04-22 ENCOUNTER — Other Ambulatory Visit: Payer: Self-pay | Admitting: Family Medicine

## 2024-04-22 DIAGNOSIS — T17998A Other foreign object in respiratory tract, part unspecified causing other injury, initial encounter: Secondary | ICD-10-CM

## 2024-05-04 ENCOUNTER — Ambulatory Visit
Admission: RE | Admit: 2024-05-04 | Discharge: 2024-05-04 | Disposition: A | Discharge status: Home / Self Care | Source: Ambulatory Visit | Attending: Family Medicine | Admitting: Family Medicine

## 2024-05-04 DIAGNOSIS — T17998A Other foreign object in respiratory tract, part unspecified causing other injury, initial encounter: Secondary | ICD-10-CM

## 2024-09-21 ENCOUNTER — Ambulatory Visit: Admitting: Neurology

## 2024-12-02 ENCOUNTER — Ambulatory Visit

## 2024-12-02 ENCOUNTER — Ambulatory Visit: Admitting: Physical Therapy

## 2024-12-02 ENCOUNTER — Ambulatory Visit: Admitting: Occupational Therapy
